# Patient Record
Sex: Female | Born: 1995 | Race: Black or African American | Hispanic: No | Marital: Single | Smoking: Never smoker
Health system: Southern US, Community
[De-identification: ages and names within clinical notes are randomized; demographics above are authoritative.]

## PROBLEM LIST (undated history)

## (undated) DIAGNOSIS — Z30017 Encounter for initial prescription of implantable subdermal contraceptive: Secondary | ICD-10-CM

## (undated) DIAGNOSIS — B009 Herpesviral infection, unspecified: Secondary | ICD-10-CM

## (undated) DIAGNOSIS — E7800 Pure hypercholesterolemia, unspecified: Secondary | ICD-10-CM

## (undated) DIAGNOSIS — I2699 Other pulmonary embolism without acute cor pulmonale: Secondary | ICD-10-CM

## (undated) DIAGNOSIS — Z789 Other specified health status: Secondary | ICD-10-CM

## (undated) HISTORY — DX: Other pulmonary embolism without acute cor pulmonale: I26.99

## (undated) HISTORY — PX: HERNIA REPAIR: SHX51

## (undated) HISTORY — DX: Encounter for initial prescription of implantable subdermal contraceptive: Z30.017

## (undated) HISTORY — PX: NO PAST SURGERIES: SHX2092

## (undated) HISTORY — PX: OTHER SURGICAL HISTORY: SHX169

---
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
## 2013-01-02 ENCOUNTER — Encounter: Payer: Self-pay | Admitting: Obstetrics and Gynecology

## 2013-01-02 ENCOUNTER — Other Ambulatory Visit (HOSPITAL_COMMUNITY): Payer: Self-pay | Admitting: Sports Medicine

## 2013-01-11 ENCOUNTER — Ambulatory Visit: Payer: Medicaid Other | Admitting: Sports Medicine

## 2013-01-15 ENCOUNTER — Other Ambulatory Visit: Payer: Self-pay | Admitting: Sports Medicine

## 2013-01-15 ENCOUNTER — Telehealth: Payer: Self-pay | Admitting: *Deleted

## 2013-01-15 DIAGNOSIS — O1414 Severe pre-eclampsia complicating childbirth: Secondary | ICD-10-CM

## 2013-01-15 MED ORDER — LABETALOL HCL 100 MG PO TABS: 50.0000 mg | ORAL_TABLET | Freq: Two times a day (BID) | ORAL | Status: DC

## 2013-01-15 NOTE — Addendum Note (Signed)
Addended by: Gaspar Bidding D on: 01/15/2013 06:09 PM   Modules accepted: Orders

## 2013-01-15 NOTE — Telephone Encounter (Signed)
Called and informed pt of appt for nexplanon insertion with Dr. Ashley Royalty.Laureen Ochs, Viann Shove

## 2013-01-15 NOTE — Telephone Encounter (Signed)
Changed dose to 50mg ; will need BP check when comes in for Nexplanon followed

## 2013-01-15 NOTE — Telephone Encounter (Signed)
On lebetalol for SEVERE RANGE PREECCLAMPSIA.  Pt now >6 weeks out.  Will titrate off lebetalol, on 100mg  bid, decrease to 50mg  bid.  Then plan to go off at follow up in 2-3 weeks, With BP recheck at [facility] appointment and then again 2-3 weeks later.  If <130/90 okay with stopping completely at this point.

## 2013-01-16 NOTE — Telephone Encounter (Signed)
Will do BP check at nexplanon visit, wants to schedule BP follow up then

## 2013-01-24 ENCOUNTER — Ambulatory Visit (INDEPENDENT_AMBULATORY_CARE_PROVIDER_SITE_OTHER): Payer: Medicaid Other | Admitting: Family Medicine

## 2013-01-24 VITALS — BP 120/77 | HR 86 | Temp 98.5°F | Wt 146.0 lb

## 2013-01-24 DIAGNOSIS — Z309 Encounter for contraceptive management, unspecified: Secondary | ICD-10-CM

## 2013-01-24 DIAGNOSIS — IMO0001 Reserved for inherently not codable concepts without codable children: Secondary | ICD-10-CM

## 2013-01-24 DIAGNOSIS — Z3046 Encounter for surveillance of implantable subdermal contraceptive: Secondary | ICD-10-CM

## 2013-01-24 DIAGNOSIS — Z30017 Encounter for initial prescription of implantable subdermal contraceptive: Secondary | ICD-10-CM

## 2013-01-24 NOTE — Patient Instructions (Addendum)
It was very nice to meet you.  Your nexplanon can stay in for 3 years, you can have it removed at any time.  You can remove the pressure bandage in 24 hours. You can remove the steristrip in 3-4 days or when it falls off. Use a back up method of birth control for the next week If you notice pus draining, have a lot of pain or swelling around the area please return to our clinic.

## 2013-01-25 DIAGNOSIS — Z3046 Encounter for surveillance of implantable subdermal contraceptive: Secondary | ICD-10-CM

## 2013-01-25 DIAGNOSIS — Z30017 Encounter for initial prescription of implantable subdermal contraceptive: Secondary | ICD-10-CM

## 2013-01-25 MED ORDER — ETONOGESTREL 68 MG ~~LOC~~ IMPL
68.0000 mg | DRUG_IMPLANT | Freq: Once | SUBCUTANEOUS | Status: AC
  Administered 2013-01-24: 68 mg via SUBCUTANEOUS

## 2013-01-27 DIAGNOSIS — Z309 Encounter for contraceptive management, unspecified: Secondary | ICD-10-CM | POA: Insufficient documentation

## 2013-01-27 NOTE — Assessment & Plan Note (Signed)
Nexplanon inserted without difficulty, tolerated well.  Patient instructed to use back up contraception x7 days post insertion.  Given red flags that should prompt her return.

## 2013-01-27 NOTE — Progress Notes (Signed)
  Subjective:    Patient ID: Emma Cantu, female    DOB: 1996-09-28, 17 y.o.   MRN: [id number]  HPI  1.  Contraception management:  Patient here because she would like to have nexplanon for contraception.  She tried to have mirena inserted previously however sound would not pass.   She is 12 weeks post partum.  She is has not returned to having periods yet, and is breast feeding.   Review of Systems Per HPI    Objective:   Physical Exam  Constitutional: She appears well-nourished. No distress.  Neurological: She is alert.   NEXPLANON INSERTION PROCEDURE NOTE  After risks and benefits reviewed and informed consent obtained and time out done, urine pregnancy test Negative.  Area prepped with betadine, and approx 2 cc of lidocaine injected into insertion site in Right arm. Target insertion site was marked with sterile marker according to Nexplanon guided ruler.   Nexplanon lot #[id number] inserted without complication.  Both provider and pt palpated the device in place.  Steristrips applied and then pressure bandage.  Pt tolerated procedure well.  Minimal blood loss.       Assessment & Plan:

## 2013-01-28 ENCOUNTER — Encounter: Payer: Medicaid Other | Admitting: Obstetrics and Gynecology

## 2013-02-14 ENCOUNTER — Ambulatory Visit (INDEPENDENT_AMBULATORY_CARE_PROVIDER_SITE_OTHER): Payer: Self-pay | Admitting: *Deleted

## 2013-02-14 VITALS — BP 118/78 | HR 80

## 2013-02-14 DIAGNOSIS — O1414 Severe pre-eclampsia complicating childbirth: Secondary | ICD-10-CM

## 2013-02-14 NOTE — Assessment & Plan Note (Signed)
Stop Lebetalol

## 2013-02-14 NOTE — Progress Notes (Signed)
In for  BP check. BP checked manually using regular adult cuff.  BP LA 118/78 and RA 118/74. Will forward message to Dr. Berline Chough . Patient states Dr. Berline Chough may discontinue labetalol.

## 2013-02-14 NOTE — Progress Notes (Signed)
Please call and have pt stop Lebetalol.  Have titrated down.  Follow up PRN

## 2013-02-15 NOTE — Progress Notes (Signed)
Called and spoke with patient yesterday afternoon and advised her to stop Labatalol per verbal order from Dr. Berline Chough.

## 2013-03-16 ENCOUNTER — Other Ambulatory Visit (HOSPITAL_COMMUNITY): Payer: Self-pay | Admitting: Sports Medicine

## 2014-04-15 ENCOUNTER — Ambulatory Visit (INDEPENDENT_AMBULATORY_CARE_PROVIDER_SITE_OTHER): Payer: Medicaid Other | Admitting: Sports Medicine

## 2014-04-15 ENCOUNTER — Other Ambulatory Visit (HOSPITAL_COMMUNITY)
Admission: RE | Admit: 2014-04-15 | Discharge: 2014-04-15 | Disposition: A | Discharge status: Home / Self Care | Payer: Medicaid Other | Source: Ambulatory Visit | Attending: Sports Medicine | Admitting: Sports Medicine

## 2014-04-15 ENCOUNTER — Encounter: Payer: Self-pay | Admitting: Sports Medicine

## 2014-04-15 VITALS — BP 134/79 | HR 92 | Temp 98.2°F | Ht 64.0 in | Wt 154.0 lb

## 2014-04-15 DIAGNOSIS — Z113 Encounter for screening for infections with a predominantly sexual mode of transmission: Secondary | ICD-10-CM | POA: Insufficient documentation

## 2014-04-15 DIAGNOSIS — Z7251 High risk heterosexual behavior: Secondary | ICD-10-CM

## 2014-04-15 LAB — RPR

## 2014-04-15 LAB — POCT URINE PREGNANCY: Preg Test, Ur: NEGATIVE

## 2014-04-15 NOTE — Progress Notes (Signed)
  Emma Cantu - 18 y.o. female MRN [id number]  Date of birth: 05/08/1996  CC & SUBJECTIVE:      Chief Complaint  Patient presents with  . Exposure to STD    asymptomatic, unprotected intercourse.  s/p Nexplanon Patient reports having consensual, unprotected intercourse.  Denies any discharge, dysuria, urinary symptoms including frequency denies any abdominal pain.  Pt denies any fevers, chills, or rigors. Patient does have Nexplanon in place.  LMP noted.     See problem based charting for additional subjective (including HPI, Interval History & ROS)   HISTORY: Wt Readings from Last 3 Encounters:  04/15/14 154 lb (69.854 kg)  01/24/13 146 lb (66.225 kg) (79%*, Z = 0.80)  12/27/12 151 lb 1.6 oz (68.539 kg) (83%*, Z = 0.96)   * Growth percentiles are based on CDC 2-20 Years data.   BP Readings from Last 3 Encounters:  04/15/14 134/79  02/14/13 118/78  01/24/13 120/77    History  Smoking status  . Never Smoker   Smokeless tobacco  . Not on file   There are no preventive care reminders to display for this patient.  Otherwise past Medical, Surgical, Social, and Family History Reviewed per EMR Medications and Allergies reviewed and updated per below.  VITALS: BP 134/79  Pulse 92  Temp(Src) 98.2 F (36.8 C) (Oral)  Ht 5\' 4"  (1.626 m)  Wt 154 lb (69.854 kg)  BMI 26.42 kg/m2  PHYSICAL EXAM: GENERAL:  young adult [place]. In no discomfort; no respiratory distress  PSYCH: alert and appropriate, good insight  No distress,   GU:  deferred     MEDICATIONS, LABS & OTHER ORDERS: Previous Medications   ACETAMINOPHEN (TYLENOL) 500 MG TABLET    Take 500 mg by mouth every 6 (six) hours as needed. For headache.   IBUPROFEN (ADVIL,MOTRIN) 600 MG TABLET    TAKE ONE TABLET BY MOUTH EVERY 6 HOURS   PRENATAL VIT-FE FUMARATE-FA (PRENATAL MULTIVITAMIN) TABS    Take 1 tablet by mouth daily.   TRIAMCINOLONE CREAM (KENALOG) 0.1 %    Apply topically 2 (two) times daily.    Modified Medications   No medications on file   New Prescriptions   No medications on file   Discontinued Medications   No medications on file   Orders Placed This Encounter  Procedures  . HIV antibody  . RPR  . POCT urine pregnancy   ASSESSMENT & PLAN: See problem based charting & AVS for pt instructions.

## 2014-04-15 NOTE — Patient Instructions (Signed)
Please sign up for my chart.  I Will send results to you through this.  Please be sure to use barrier methods including condoms with any sexual intercourse to decrease your risk of infection.

## 2014-04-15 NOTE — Assessment & Plan Note (Signed)
Acute condition, asymptomatic, high risk behavior 1. First void urine GC chlamydia HIV, RPR, urine pregnancy 2. Discuss safe sexual practices including using condoms

## 2014-04-16 LAB — HIV ANTIBODY (ROUTINE TESTING W REFLEX): HIV: NONREACTIVE

## 2014-04-16 LAB — URINE CYTOLOGY ANCILLARY ONLY
CHLAMYDIA, DNA PROBE: NEGATIVE
Neisseria Gonorrhea: NEGATIVE

## 2014-08-24 ENCOUNTER — Encounter (HOSPITAL_COMMUNITY): Payer: Self-pay | Admitting: Emergency Medicine

## 2014-08-24 ENCOUNTER — Emergency Department (HOSPITAL_COMMUNITY)
Admission: EM | Admit: 2014-08-24 | Discharge: 2014-08-24 | Disposition: A | Discharge status: Home / Self Care | Payer: Medicaid Other | Attending: Emergency Medicine | Admitting: Emergency Medicine

## 2014-08-24 DIAGNOSIS — H9210 Otorrhea, unspecified ear: Secondary | ICD-10-CM | POA: Insufficient documentation

## 2014-08-24 DIAGNOSIS — H9209 Otalgia, unspecified ear: Secondary | ICD-10-CM | POA: Insufficient documentation

## 2014-08-24 DIAGNOSIS — IMO0002 Reserved for concepts with insufficient information to code with codable children: Secondary | ICD-10-CM | POA: Insufficient documentation

## 2014-08-24 DIAGNOSIS — H6092 Unspecified otitis externa, left ear: Secondary | ICD-10-CM

## 2014-08-24 DIAGNOSIS — H60399 Other infective otitis externa, unspecified ear: Secondary | ICD-10-CM | POA: Insufficient documentation

## 2014-08-24 DIAGNOSIS — J3489 Other specified disorders of nose and nasal sinuses: Secondary | ICD-10-CM | POA: Insufficient documentation

## 2014-08-24 DIAGNOSIS — Z79899 Other long term (current) drug therapy: Secondary | ICD-10-CM | POA: Insufficient documentation

## 2014-08-24 MED ORDER — ANTIPYRINE-BENZOCAINE 5.4-1.4 % OT SOLN: 3.0000 [drp] | OTIC | Status: DC | PRN

## 2014-08-24 MED ORDER — HYDROCODONE-ACETAMINOPHEN 5-325 MG PO TABS: 1.0000 | ORAL_TABLET | Freq: Four times a day (QID) | ORAL | Status: DC | PRN

## 2014-08-24 MED ORDER — ONDANSETRON 4 MG PO TBDP
4.0000 mg | ORAL_TABLET | Freq: Once | ORAL | Status: AC
  Administered 2014-08-24: 4 mg via ORAL
  Filled 2014-08-24: qty 1

## 2014-08-24 MED ORDER — NEOMYCIN-POLYMYXIN-HC 3.5-10000-1 OT SUSP: 3.0000 [drp] | Freq: Three times a day (TID) | OTIC | Status: DC

## 2014-08-24 MED ORDER — HYDROCODONE-ACETAMINOPHEN 5-325 MG PO TABS
1.0000 | ORAL_TABLET | Freq: Once | ORAL | Status: AC
  Administered 2014-08-24: 1 via ORAL
  Filled 2014-08-24: qty 1

## 2014-08-24 NOTE — ED Notes (Signed)
Pt reports having a left sided ear ache that started yesterday. Pt reports that when she touches the ear, the ear hurts. Pt reports that the ear has been draining a white colored discharge. Pt also having nasal congestion. Pt is A/O x4 and in NAD.

## 2014-08-24 NOTE — ED Provider Notes (Signed)
Medical screening examination/treatment/procedure(s) were performed by non-physician practitioner and as supervising physician I was immediately available for consultation/collaboration.   EKG Interpretation None       Mckynna Vanloan, MD 08/24/14 1723 

## 2014-08-24 NOTE — ED Provider Notes (Signed)
CSN: [id number]     Arrival date & time 08/24/14  1252 History  This chart was scribed for non-physician practitioner, Arthor Captain, PA-C,working with Doug Sou, MD, by Karle Plumber, ED Scribe. This patient was seen in room WTR7/WTR7 and the patient's care was started at 1:39 PM.  Chief Complaint  Patient presents with  . Otalgia  . Nasal Congestion   The history is provided by the patient. No language interpreter was used.   HPI Comments:  Emma Cantu is a 18 y.o. female who presents to the Emergency Department complaining of moderate, throbbing left ear pain that began yesterday. She reports associated purulent drainage of the ear and nasal congestion that began 3-4 weeks ago. She denies any recent swimming. She denies fever, chills or sore throat.  Past Medical History  Diagnosis Date  . No pertinent past medical history   . Nexplanon insertion 12/2012    Right Arm   Past Surgical History  Procedure Laterality Date  . No past surgeries    . Inguanal hernia repair     Family History  Problem Relation Age of Onset  . Hyperlipidemia Mother   . Hypertension Mother   . Cancer Maternal Grandmother   . Cancer Maternal Grandfather   . Diabetes Paternal Grandmother   . Diabetes Paternal Grandfather    History  Substance Use Topics  . Smoking status: Never Smoker   . Smokeless tobacco: Not on file  . Alcohol Use: No   OB History   Grav Para Term Preterm Abortions TAB SAB Ect Mult Living   0 0 0 0 0 0 1     Review of Systems  Constitutional: Negative for fever and chills.  HENT: Positive for congestion, ear discharge and ear pain. Negative for sore throat.     Allergies  Review of patient's allergies indicates no known allergies.  Home Medications   Prior to Admission medications   Medication Sig Start Date End Date Taking? Authorizing Provider  acetaminophen (TYLENOL) 500 MG tablet Take 500 mg by mouth every 6 (six) hours as needed. For headache.     Historical Provider, MD  ibuprofen (ADVIL,MOTRIN) 600 MG tablet TAKE ONE TABLET BY MOUTH EVERY 6 HOURS 03/16/13   Andrena Mews, DO  Prenatal Vit-Fe Fumarate-FA (PRENATAL MULTIVITAMIN) TABS Take 1 tablet by mouth daily.    Historical Provider, MD  triamcinolone cream (KENALOG) 0.1 % Apply topically 2 (two) times daily. 11/16/12   Ivy de Lawson Radar, DO   Triage Vitals: BP 131/76  Pulse 88  Temp(Src) 99.1 F (37.3 C) (Oral)  Resp 18  SpO2 98%  LMP 07/17/2014 Physical Exam  Nursing note and vitals reviewed. Constitutional: She is oriented to person, place, and time. She appears well-developed and well-nourished.  HENT:  Head: Normocephalic and atraumatic.  Right Ear: Tympanic membrane and ear canal normal.  Left Ear: Tympanic membrane normal. There is drainage and tenderness.  Purulence in canal of left ear. Left TM is visualized and normal.  Eyes: EOM are normal.  Neck: Normal range of motion.  Cardiovascular: Normal rate.   Pulmonary/Chest: Effort normal.  Musculoskeletal: Normal range of motion.  Neurological: She is alert and oriented to person, place, and time.  Skin: Skin is warm and dry.  Psychiatric: She has a normal mood and affect. Her behavior is normal.    ED Course  Procedures (including critical care time) DIAGNOSTIC STUDIES: Oxygen Saturation is 98% on RA, normal by my interpretation.   COORDINATION  OF CARE: 1:43 PM- Will prescribe antibiotic otic drops. Pt verbalizes understanding and agrees to plan.  Medications - No data to display  Labs Review Labs Reviewed - No data to display  Imaging Review No results found.   EKG Interpretation None      MDM   Final diagnoses:  Otitis externa, left    Otitis externa  Pt presenting with otitis externa after swimming. No canal occlusion, Pt afebrile in NAD. Exam non concerning for mastoiditis, cellulitis or malignant OE.  Dc with cortisporin script. Follow up in 2-3 days if no improvement with treatment or no  complete resolution by 7 days.   I personally performed the services described in this documentation, which was scribed in my presence. The recorded information has been reviewed and is accurate.    Arthor Captain, PA-C 08/24/14 1429

## 2014-10-27 ENCOUNTER — Encounter (HOSPITAL_COMMUNITY): Payer: Self-pay | Admitting: Emergency Medicine

## 2016-03-07 ENCOUNTER — Ambulatory Visit (INDEPENDENT_AMBULATORY_CARE_PROVIDER_SITE_OTHER): Payer: Medicaid Other | Admitting: Family Medicine

## 2016-03-07 ENCOUNTER — Encounter: Payer: Self-pay | Admitting: Family Medicine

## 2016-03-07 VITALS — BP 111/61 | HR 82 | Temp 98.6°F | Ht 64.0 in | Wt 211.2 lb

## 2016-03-07 DIAGNOSIS — Z3046 Encounter for surveillance of implantable subdermal contraceptive: Secondary | ICD-10-CM | POA: Diagnosis not present

## 2016-03-07 NOTE — Progress Notes (Signed)
Subjective: Emma Cantu is a 20 y.o. female presenting for nexplanon removal.  Had nexplanon placed 01/24/2013. Would like to practice barrier conraception and discuss alternative hormonal contraception at a separate office visit.   - ROS: No recent arm infections.  - Non-smoker  Objective: Ht 5\' 4"  (1.626 m)  Wt 211 lb 3.2 oz (95.8 kg)  BMI 36.23 kg/m2  LMP 02/15/2016 (Approximate) Gen: Obese, well-appearing 20 y.o. female in no distress RUE: Superficial axially-oriented rod in subcutaneous fat without overlaying erythema or warmth.   PROCEDURE NOTE: Nexplanon Removal  Patient given informed consent for removal of nexplanon including risks, benefits and alternatives to procedures. All questions answered.  Signed copy in the chart.  Appropriate time out taken.  Nexplanon site identified in right upper arm.  Area prepped in usual sterile fashon. One cc of 1% lidocaine was used to anesthetize the area overlying the distal tip of the implant. A small stab incision was made right beside the implant on the distal portion. The nexplanon rod was grasped using hemostat and removed without difficulty. There was less than 3 cc blood loss. There were no complications. A small amount of antibiotic ointment and steri-strips were applied over the small incision. A pressure bandage was applied to reduce any bruising. The patient tolerated the procedure well and was given post procedure instructions.  Drs. Hensel and Gwendolyn GrantWalden were readily available, were there any questions or issues.   Assessment/Plan: Emma Cantu is a 20 y.o. female here for nexplanon removal. - Regular wound care instructions provided - To return if signs of infection develop - Follow up soon to discuss contraception. Will use barrier contraception in the meantime.

## 2016-03-07 NOTE — Patient Instructions (Signed)
Sterile Tape Wound Care °Some cuts and wounds can be closed using sterile tape, also called skin adhesive strips. Skin adhesive strips can be used for shallow (superficial) and simple cuts, wounds, lacerations, and surgical incisions. These strips act in place of stitches to hold the edges of the wound together, allowing for faster healing. Unlike stitches, the adhesive strips do not require needles or anesthetic medicine for placement. The strips will wear off naturally as the wound is healing. It is important to take proper care of your wound at home while it heals.  °HOME CARE INSTRUCTIONS °· Try to keep the area around your wound clean and dry. Do not allow the adhesive strips to get wet for the first 12 hours.   °· Do not use any soaps or ointments on the wound for the first 12 hours.   °· If a bandage (dressing) has been applied, follow your health care provider's instructions for how often to change the dressing. Keep the dressing dry if one has been applied.   °· Do not remove the adhesive strips. They will fall off on their own. If they do not, you may remove them gently after 10 days. You should gently wet the strips before removing them. For example, this can be done in the shower. °· Do not scratch, pick, or rub the wound area.   °· Protect the wound from further injury until it is healed.   °· Protect the wound from sun and tanning bed exposure while it is healing and for several weeks after healing.   °· Only take over-the-counter or prescription medicines as directed by your health care provider.   °· Keep all follow-up appointments as directed by your health care provider.   °SEEK MEDICAL CARE IF: °Your adhesive strips become wet or soaked with blood before the wound has healed. The tape will need to be replaced.  °SEEK IMMEDIATE MEDICAL CARE IF: °· You have increasing pain in the wound.   °· You develop a rash after the strips are applied. °· Your wound becomes red, swollen, hot, or tender.   °· You  have a red streak that goes away from the wound.   °· You have pus coming from the wound.   °· You have increased bleeding from the wound. °· You notice a bad smell coming from the wound.   °· Your wound breaks open. °MAKE SURE YOU: °· Understand these instructions. °· Will watch your condition. °· Will get help right away if you are not doing well or get worse. °  °This information is not intended to replace advice given to you by your health care provider. Make sure you discuss any questions you have with your health care provider. °  °Document Released: 01/19/2005 Document Revised: 01/02/2015 Document Reviewed: 07/03/2013 °Elsevier Interactive Patient Education ©2016 Elsevier Inc. ° °

## 2016-04-21 ENCOUNTER — Ambulatory Visit (INDEPENDENT_AMBULATORY_CARE_PROVIDER_SITE_OTHER): Payer: Medicaid Other | Admitting: Family Medicine

## 2016-04-21 ENCOUNTER — Encounter: Payer: Self-pay | Admitting: Family Medicine

## 2016-04-21 VITALS — BP 126/68 | HR 85 | Temp 98.2°F | Ht 64.0 in | Wt 208.0 lb

## 2016-04-21 DIAGNOSIS — Z3009 Encounter for other general counseling and advice on contraception: Secondary | ICD-10-CM | POA: Diagnosis present

## 2016-04-21 NOTE — Assessment & Plan Note (Signed)
After discussion of [name] options, she chooses to proceed with [name]. All questions answered, handout provided. Benefits and risks discussed at length regarding insertion procedure and ongoing care. - Will schedule appointment for insertion procedure.  - Will also get pap smear prior to insertion - Will need UPreg if not within 5 days of LMP.

## 2016-04-21 NOTE — Patient Instructions (Signed)
Thank you for coming in today!  - Schedule an appointment with me to have the [name] inserted. We will also perform a pap smear at that visit, as you are due for this.   Our clinic's number is [phone]. Feel free to call any time with questions or concerns. We will answer any questions after hours with our 24-hour emergency line at that number as well.   - Dr. Jarvis NewcomerGrunz  Levonorgestrel intrauterine device (IUD) What is this medicine? LEVONORGESTREL IUD (LEE voe nor jes trel) is a contraceptive (birth control) device. The device is placed inside the uterus by a healthcare professional. It is used to prevent pregnancy and can also be used to treat heavy bleeding that occurs during your period. Depending on the device, it can be used for 3 to 5 years. This medicine may be used for other purposes; ask your health care provider or pharmacist if you have questions. What should I tell my health care provider before I take this medicine? They need to know if you have any of these conditions: -abnormal Pap smear -cancer of the breast, uterus, or cervix -diabetes -endometritis -genital or pelvic infection now or in the past -have more than one sexual partner or your partner has more than one partner -heart disease -history of an ectopic or tubal pregnancy -immune system problems -IUD in place -liver disease or tumor -problems with blood clots or take blood-thinners -use intravenous drugs -uterus of unusual shape -vaginal bleeding that has not been explained -an unusual or allergic reaction to levonorgestrel, other hormones, silicone, or polyethylene, medicines, foods, dyes, or preservatives -pregnant or trying to get pregnant -breast-feeding How should I use this medicine? This device is placed inside the uterus by a health care professional. Talk to your pediatrician regarding the use of this medicine in children. Special care may be needed. Overdosage: If you think you have taken too  much of this medicine contact a poison control center or emergency room at once. NOTE: This medicine is only for you. Do not share this medicine with others. What if I miss a dose? This does not apply. What may interact with this medicine? Do not take this medicine with any of the following medications: -amprenavir -bosentan -fosamprenavir This medicine may also interact with the following medications: -aprepitant -barbiturate medicines for inducing sleep or treating seizures -bexarotene -griseofulvin -medicines to treat seizures like carbamazepine, ethotoin, felbamate, oxcarbazepine, phenytoin, topiramate -modafinil -pioglitazone -rifabutin -rifampin -rifapentine -some medicines to treat HIV infection like atazanavir, indinavir, lopinavir, nelfinavir, tipranavir, ritonavir -St. John's wort -warfarin This list may not describe all possible interactions. Give your health care provider a list of all the medicines, herbs, non-prescription drugs, or dietary supplements you use. Also tell them if you smoke, drink alcohol, or use illegal drugs. Some items may interact with your medicine. What should I watch for while using this medicine? Visit your doctor or health care professional for regular check ups. See your doctor if you or your partner has sexual contact with others, becomes HIV positive, or gets a sexual transmitted disease. This product does not protect you against HIV infection (AIDS) or other sexually transmitted diseases. You can check the placement of the IUD yourself by reaching up to the top of your vagina with clean fingers to feel the threads. Do not pull on the threads. It is a good habit to check placement after each menstrual period. Call your doctor right away if you feel more of the IUD than just the threads or  if you cannot feel the threads at all. The IUD may come out by itself. You may become pregnant if the device comes out. If you notice that the IUD has come out use  a backup birth control method like condoms and call your health care provider. Using tampons will not change the position of the IUD and are okay to use during your period. What side effects may I notice from receiving this medicine? Side effects that you should report to your doctor or health care professional as soon as possible: -allergic reactions like skin rash, itching or hives, swelling of the face, lips, or tongue -fever, flu-like symptoms -genital sores -high blood pressure -no menstrual period for 6 weeks during use -pain, swelling, warmth in the leg -pelvic pain or tenderness -severe or sudden headache -signs of pregnancy -stomach cramping -sudden shortness of breath -trouble with balance, talking, or walking -unusual vaginal bleeding, discharge -yellowing of the eyes or skin Side effects that usually do not require medical attention (report to your doctor or health care professional if they continue or are bothersome): -acne -breast pain -change in sex drive or performance -changes in weight -cramping, dizziness, or faintness while the device is being inserted -headache -irregular menstrual bleeding within first 3 to 6 months of use -nausea This list may not describe all possible side effects. Call your doctor for medical advice about side effects. You may report side effects to FDA at 1-800-FDA-1088. Where should I keep my medicine? This does not apply. NOTE: This sheet is a summary. It may not cover all possible information. If you have questions about this medicine, talk to your doctor, pharmacist, or health care provider.    2016, Elsevier/Gold Standard. (2012-01-12 13:54:04)

## 2016-04-21 NOTE — Progress Notes (Signed)
Subjective: Emma Cantu is a 20 y.o. G1P1 presenting to discuss contraception.   She reports good experience with nexplanon following the birth of her only child, having normal menses alternating with no bleeding, without spotting. Her mother reports she had mood swings with this, and would prefer Emma Cantu to have something else for birth control. She would prefer [name] over pills/patch. After online research and speaking with friends, she has the most interest in [name] vs. copper IUD. It is important that she experience as little unscheduled vaginal bleeding as possible. She denies smoking, personal or family history of clots, recent sexual activity (have been using condoms since removal of nexplanon). She does not desire to be pregnant for the next 5 years.   - ROS: As above and no vaginal bleeding, discharge, abd pain, fevers.  - Non-smoker  Objective: BP 126/68 mmHg  Pulse 85  Temp(Src) 98.2 F (36.8 C) (Oral)  Ht 5\' 4"  (1.626 m)  Wt 208 lb (94.348 kg)  BMI 35.69 kg/m2  LMP 04/09/2016 Gen: Well-appearing 20 y.o. female in no distress  Assessment/Plan: Emma Cantu is a 20 y.o. female here for contraceptive counseling.  Contraception management After discussion of [name] options, she chooses to proceed with [name]. All questions answered, handout provided. Benefits and risks discussed at length regarding insertion procedure and ongoing care. - Will schedule appointment for insertion procedure.  - Will also get pap smear prior to insertion - Will need UPreg if not within 5 days of LMP.

## 2016-04-28 ENCOUNTER — Ambulatory Visit (INDEPENDENT_AMBULATORY_CARE_PROVIDER_SITE_OTHER): Payer: Medicaid Other | Admitting: Family Medicine

## 2016-04-28 ENCOUNTER — Other Ambulatory Visit (HOSPITAL_COMMUNITY)
Admission: RE | Admit: 2016-04-28 | Discharge: 2016-04-28 | Disposition: A | Discharge status: Home / Self Care | Payer: Medicaid Other | Source: Ambulatory Visit | Attending: Family Medicine | Admitting: Family Medicine

## 2016-04-28 ENCOUNTER — Encounter: Payer: Self-pay | Admitting: Family Medicine

## 2016-04-28 VITALS — BP 124/70 | HR 81 | Temp 98.4°F | Ht 64.0 in | Wt 209.3 lb

## 2016-04-28 DIAGNOSIS — Z01419 Encounter for gynecological examination (general) (routine) without abnormal findings: Secondary | ICD-10-CM | POA: Insufficient documentation

## 2016-04-28 DIAGNOSIS — Z124 Encounter for screening for malignant neoplasm of cervix: Secondary | ICD-10-CM | POA: Diagnosis not present

## 2016-04-28 DIAGNOSIS — Z3009 Encounter for other general counseling and advice on contraception: Secondary | ICD-10-CM

## 2016-04-28 DIAGNOSIS — Z30019 Encounter for initial prescription of contraceptives, unspecified: Secondary | ICD-10-CM

## 2016-04-28 DIAGNOSIS — Z3040 Encounter for surveillance of contraceptives, unspecified: Secondary | ICD-10-CM

## 2016-04-28 DIAGNOSIS — Z30017 Encounter for initial prescription of implantable subdermal contraceptive: Secondary | ICD-10-CM | POA: Diagnosis not present

## 2016-04-28 LAB — POCT URINE PREGNANCY: Preg Test, Ur: NEGATIVE

## 2016-04-28 MED ORDER — ETONOGESTREL 68 MG ~~LOC~~ IMPL
68.0000 mg | DRUG_IMPLANT | Freq: Once | SUBCUTANEOUS | Status: AC
  Administered 2016-04-28: 68 mg via SUBCUTANEOUS

## 2016-04-28 NOTE — Progress Notes (Signed)
I was asked to place nexplanon during today's visit, as Dr. Jarvis NewcomerGrunz is not certified for nexplanon insertion.  Reviewed history with patient. No history of blood clots, stroke, heart attack. Nonsmoker. Has had nexplanon before and liked it. Last unprotected intercourse was >3 weeks ago. LMP in mid April. Urine pregnancy test today negative.  Nexplanon Insertion Procedure Patient was given informed consent, she signed consent form.  Patient does understand that irregular bleeding is a very common side effect of this medication. She was advised to have backup contraception for one week after placement. Pregnancy test in clinic today was negative.  Appropriate time out taken.  The ruler used to measure and mark insertion area in right arm.  Patient was prepped with alcohol swab and then injected with 1 ml of 1% lidocaine without epinephrine.  She was prepped with betadine, Nexplanon removed from packaging, then inserted full length of needle and withdrawn per handbook instructions. Nexplanon was able to palpated in the patient's arm; patient palpated the insert herself. There was minimal blood loss.  Patient insertion site covered with steristrip and a pressure bandage to reduce any bruising.  The patient tolerated the procedure well and was given post procedure instructions to keep area clean/dry for next 24 hours. After that point may shower.  Levert FeinsteinBrittany Mirha Brucato, MD [facility]

## 2016-04-29 NOTE — Progress Notes (Signed)
Subjective: Emma Cantu is a 20 y.o. female returning for [name].   She reports that she would prefer to proceed with nexplanon rather than [name] because she is sure of what she will get out of it and cannot be certain of mirena.   - ROS: No fevers, chills, abd pain, vaginal pain, discharge, bleeding.  - Non-smoker  Objective: BP 124/70 mmHg  Pulse 81  Temp(Src) 98.4 F (36.9 C) (Oral)  Ht 5\' 4"  (1.626 m)  Wt 209 lb 4.8 oz (94.938 kg)  BMI 35.91 kg/m2  LMP 04/09/2016 Gen: Well 20 y.o. female in no distress Pelvic: Normal external female genitalia without lesions. Vaginal mucosa and cervix normal without lesions, discharge or bleeding noted on speculum exam. No cervical motion tenderness.  - April Zimmerman Rumple, CMA present throughout duration of exam.  UPreg negative  Assessment/Plan: Emma Cantu is a 20 y.o. female here for [name] and pap smear. - Pap smear obtained today - Nexplanon inserted by Dr. Pollie MeyerMcIntyre, see other visit note.

## 2016-04-29 NOTE — Patient Instructions (Signed)
Etonogestrel implant What is this medicine? ETONOGESTREL (et oh noe JES trel) is a contraceptive (birth control) device. It is used to prevent pregnancy. It can be used for up to 3 years. This medicine may be used for other purposes; ask your health care provider or pharmacist if you have questions. What should I tell my health care provider before I take this medicine? They need to know if you have any of these conditions: -abnormal vaginal bleeding -blood vessel disease or blood clots -cancer of the breast, cervix, or liver -depression -diabetes -gallbladder disease -headaches -heart disease or recent heart attack -high blood pressure -high cholesterol -kidney disease -liver disease -renal disease -seizures -tobacco smoker -an unusual or allergic reaction to etonogestrel, other hormones, anesthetics or antiseptics, medicines, foods, dyes, or preservatives -pregnant or trying to get pregnant -breast-feeding How should I use this medicine? This device is inserted just under the skin on the inner side of your upper arm by a health care professional. Talk to your pediatrician regarding the use of this medicine in children. Special care may be needed. Overdosage: If you think you have taken too much of this medicine contact a poison control center or emergency room at once. NOTE: This medicine is only for you. Do not share this medicine with others. What if I miss a dose? This does not apply. What may interact with this medicine? Do not take this medicine with any of the following medications: -amprenavir -bosentan -fosamprenavir This medicine may also interact with the following medications: -barbiturate medicines for inducing sleep or treating seizures -certain medicines for fungal infections like ketoconazole and itraconazole -griseofulvin -medicines to treat seizures like carbamazepine, felbamate, oxcarbazepine, phenytoin,  topiramate -modafinil -phenylbutazone -rifampin -some medicines to treat HIV infection like atazanavir, indinavir, lopinavir, nelfinavir, tipranavir, ritonavir -St. John's wort This list may not describe all possible interactions. Give your health care provider a list of all the medicines, herbs, non-prescription drugs, or dietary supplements you use. Also tell them if you smoke, drink alcohol, or use illegal drugs. Some items may interact with your medicine. What should I watch for while using this medicine? This product does not protect you against HIV infection (AIDS) or other sexually transmitted diseases. You should be able to feel the implant by pressing your fingertips over the skin where it was inserted. Contact your doctor if you cannot feel the implant, and use a non-hormonal birth control method (such as condoms) until your doctor confirms that the implant is in place. If you feel that the implant may have broken or become bent while in your arm, contact your healthcare provider. What side effects may I notice from receiving this medicine? Side effects that you should report to your doctor or health care professional as soon as possible: -allergic reactions like skin rash, itching or hives, swelling of the face, lips, or tongue -breast lumps -changes in emotions or moods -depressed mood -heavy or prolonged menstrual bleeding -pain, irritation, swelling, or bruising at the insertion site -scar at site of insertion -signs of infection at the insertion site such as fever, and skin redness, pain or discharge -signs of pregnancy -signs and symptoms of a blood clot such as breathing problems; changes in vision; chest pain; severe, sudden headache; pain, swelling, warmth in the leg; trouble speaking; sudden numbness or weakness of the face, arm or leg -signs and symptoms of liver injury like dark yellow or brown urine; general ill feeling or flu-like symptoms; light-colored stools; loss of  appetite; nausea; right upper belly   pain; unusually weak or tired; yellowing of the eyes or skin -unusual vaginal bleeding, discharge -signs and symptoms of a stroke like changes in vision; confusion; trouble speaking or understanding; severe headaches; sudden numbness or weakness of the face, arm or leg; trouble walking; dizziness; loss of balance or coordination Side effects that usually do not require medical attention (Report these to your doctor or health care professional if they continue or are bothersome.): -acne -back pain -breast pain -changes in weight -dizziness -general ill feeling or flu-like symptoms -headache -irregular menstrual bleeding -nausea -sore throat -vaginal irritation or inflammation This list may not describe all possible side effects. Call your doctor for medical advice about side effects. You may report side effects to FDA at 1-800-FDA-1088. Where should I keep my medicine? This drug is given in a hospital or clinic and will not be stored at home. NOTE: This sheet is a summary. It may not cover all possible information. If you have questions about this medicine, talk to your doctor, pharmacist, or health care provider.    2016, Elsevier/Gold Standard. (2014-09-26 14:07:06)  

## 2016-05-02 ENCOUNTER — Telehealth: Payer: Self-pay | Admitting: Family Medicine

## 2016-05-02 NOTE — Telephone Encounter (Signed)
I can't see a formal diagnosis of eczema and I've not evaluated her for this. She will need to schedule an appointment for evaluation.

## 2016-05-02 NOTE — Telephone Encounter (Signed)
Pt called and has eczema and she has been using OTC medication for this. At this time the OTC medication is no longer working and she would like the doctor to call in something to her pharmacy. jw

## 2016-05-03 LAB — CYTOLOGY - PAP

## 2016-05-03 NOTE — Telephone Encounter (Signed)
PT has same day appointment scheduled for tomorrow, she states that she is broke out everywhere. Lamonte SakaiZimmerman Rumple, April D, [place]

## 2016-05-04 ENCOUNTER — Ambulatory Visit: Payer: Medicaid Other | Admitting: Family Medicine

## 2016-05-26 ENCOUNTER — Ambulatory Visit: Payer: Medicaid Other

## 2016-06-09 ENCOUNTER — Ambulatory Visit (INDEPENDENT_AMBULATORY_CARE_PROVIDER_SITE_OTHER): Payer: Medicaid Other | Admitting: Family Medicine

## 2016-06-09 ENCOUNTER — Other Ambulatory Visit (HOSPITAL_COMMUNITY)
Admission: RE | Admit: 2016-06-09 | Discharge: 2016-06-09 | Disposition: A | Discharge status: Home / Self Care | Payer: Medicaid Other | Source: Ambulatory Visit | Attending: Family Medicine | Admitting: Family Medicine

## 2016-06-09 VITALS — BP 125/79 | HR 79 | Temp 98.6°F | Resp 18 | Ht 64.0 in | Wt 205.8 lb

## 2016-06-09 DIAGNOSIS — Z124 Encounter for screening for malignant neoplasm of cervix: Secondary | ICD-10-CM | POA: Diagnosis not present

## 2016-06-09 DIAGNOSIS — J029 Acute pharyngitis, unspecified: Secondary | ICD-10-CM | POA: Diagnosis not present

## 2016-06-09 DIAGNOSIS — Z01419 Encounter for gynecological examination (general) (routine) without abnormal findings: Secondary | ICD-10-CM | POA: Insufficient documentation

## 2016-06-09 LAB — POCT RAPID STREP A (OFFICE): Rapid Strep A Screen: NEGATIVE

## 2016-06-09 NOTE — Progress Notes (Signed)
S: Patient had pap smear with no component of transformation zone so is here for repeat pap, Bo hx prior abnormal paps. Also c/o sore throat 1 week maybe improving. No fever. O: Overweight femal NAD GU extrernally normal. No adnexal masses or uterine tenderness on bimanual exam Cervix appears normal on speculum exam. HEENT: Op is without exudate or lesion. No LAD in neck  A/P: 1. sore throat--Strep test obtained--negative. Likely allergic rhinitis. Try OTC claritin or zyrtec.  2. Inadequate pap : repeat Pap obtained.

## 2016-06-10 LAB — CYTOLOGY - PAP

## 2016-06-13 ENCOUNTER — Encounter: Payer: Self-pay | Admitting: Family Medicine

## 2016-06-24 ENCOUNTER — Ambulatory Visit: Payer: Medicaid Other | Admitting: Internal Medicine

## 2016-06-24 ENCOUNTER — Ambulatory Visit: Payer: Medicaid Other | Admitting: Family Medicine

## 2016-09-23 ENCOUNTER — Encounter: Payer: Self-pay | Admitting: Family Medicine

## 2016-09-23 ENCOUNTER — Ambulatory Visit (INDEPENDENT_AMBULATORY_CARE_PROVIDER_SITE_OTHER): Payer: Medicaid Other | Admitting: Family Medicine

## 2016-09-23 DIAGNOSIS — L309 Dermatitis, unspecified: Secondary | ICD-10-CM | POA: Insufficient documentation

## 2016-09-23 MED ORDER — HYDROCORTISONE 2.5 % EX CREA: TOPICAL_CREAM | Freq: Two times a day (BID) | CUTANEOUS | 8 refills | Status: DC

## 2016-09-23 NOTE — Assessment & Plan Note (Addendum)
Patient reported no relief from over-the-counter hydrocortisone and various emollients.  She has very mild eczema.  - Start hydrocortisone 2.5% 2 times daily for 2-4 weeks - Continue emollient several times daily along with hydrocortisone - Use moisturizing soaps and continue to moisturize after bathing

## 2016-09-23 NOTE — Progress Notes (Signed)
    Subjective:  Emma Cantu is a 20 y.o. female who [facility] with a chief complaint of Eczema  HPI:  Eczema: Patient has had eczema on and off throughout her life.  It has gotten worse over the past 3 years after she had her daughter.  She reports that over-the-counter medications have not been helping.  He has tried 1% hydrocortisone cream and various emollients.  Notes that she has eczema on her face sparing the nose, shoulders and back of the neck and her right wrist and all areas are extremely itchy.  Patient denies any shortness of breath or wheezing.   PMH: Allergic rhinitis ROS: The history of present illness  Objective:  Physical Exam: BP 125/68   Pulse 74   Temp 99.1 F (37.3 C) (Oral)   Ht 5\' 4"  (1.626 m)   Wt 206 lb 3.2 oz (93.5 kg)   BMI 35.39 kg/m   Gen: 20 year old female in NAD, resting comfortably CV: RRR with no murmurs appreciated Pulm: NWOB, CTAB with no crackles, wheezes, or rhonchi GI: Normal bowel sounds present. Soft, Nontender, Nondistended. MSK: no edema, cyanosis, or clubbing noted Skin: warm, dry.  Did not appreciate any erythematous lesions, no rash.   Neuro: grossly normal, moves all extremities Psych: Normal affect and thought content  No results found for this or any previous visit (from the past 72 hour(s)).   Assessment/Plan:  Eczema Patient reported no relief from over-the-counter hydrocortisone and various emollients.  She has very mild eczema.  - Start hydrocortisone 2.5% 2 times daily for 2-4 weeks - Continue emollient several times daily along with hydrocortisone - Use moisturizing soaps and continue to moisturize after bathing

## 2016-09-23 NOTE — Patient Instructions (Signed)
Emma Cantu, he was seen today for evaluation of your eczema.  I have prescribed 2.5 percent hydrocortisone that you can apply 2 times daily. I recommend using this for 2-4 weeks or less if you see results and then 3-4 more days after that.    He should also be using an emollient several times a day in addition to the hydrocortisone. Things that can help with her eczema is to avoid showering every day and pat yourself dry after showers and apply moisturizer after.  Also be sure to use a moisturizing soap  Take care, Mitch Arquette L. Myrtie SomanWarden, MD [facility] Resident PGY-1 09/23/2016 4:07 PM

## 2017-05-23 ENCOUNTER — Ambulatory Visit (INDEPENDENT_AMBULATORY_CARE_PROVIDER_SITE_OTHER): Payer: Medicaid Other | Admitting: Family Medicine

## 2017-05-23 ENCOUNTER — Other Ambulatory Visit (HOSPITAL_COMMUNITY)
Admission: RE | Admit: 2017-05-23 | Discharge: 2017-05-23 | Disposition: A | Discharge status: Home / Self Care | Payer: Medicaid Other | Source: Ambulatory Visit | Attending: Family Medicine | Admitting: Family Medicine

## 2017-05-23 ENCOUNTER — Other Ambulatory Visit: Payer: Self-pay | Admitting: Family Medicine

## 2017-05-23 VITALS — BP 120/68 | HR 84 | Temp 99.9°F | Ht 64.0 in | Wt 204.0 lb

## 2017-05-23 DIAGNOSIS — Z3009 Encounter for other general counseling and advice on contraception: Secondary | ICD-10-CM | POA: Insufficient documentation

## 2017-05-23 DIAGNOSIS — Z113 Encounter for screening for infections with a predominantly sexual mode of transmission: Secondary | ICD-10-CM | POA: Diagnosis not present

## 2017-05-23 DIAGNOSIS — Z202 Contact with and (suspected) exposure to infections with a predominantly sexual mode of transmission: Secondary | ICD-10-CM | POA: Diagnosis not present

## 2017-05-23 LAB — POCT WET PREP (WET MOUNT)
CLUE CELLS WET PREP WHIFF POC: POSITIVE
Trichomonas Wet Prep HPF POC: ABSENT

## 2017-05-23 MED ORDER — METRONIDAZOLE 500 MG PO TABS: 500.0000 mg | ORAL_TABLET | Freq: Two times a day (BID) | ORAL | 0 refills | Status: DC

## 2017-05-23 MED ORDER — CEFTRIAXONE SODIUM 250 MG IJ SOLR
250.0000 mg | Freq: Once | INTRAMUSCULAR | Status: AC
  Administered 2017-05-23: 250 mg via INTRAMUSCULAR

## 2017-05-23 MED ORDER — AZITHROMYCIN 500 MG PO TABS
1000.0000 mg | ORAL_TABLET | Freq: Once | ORAL | Status: AC
  Administered 2017-05-23: 1000 mg via ORAL

## 2017-05-23 NOTE — Progress Notes (Signed)
   Subjective:   Emma Cantu is a 21 y.o. female with a history of Allergic rhinitis, eczema here for same day appt for  Chief Complaint  Patient presents with  . std check    Patient is here today for STD testing. Her boyfriend and sole sexual partner for the last 7 months told her last week that he tested positive for chlamydia after having sexual intercourse with another woman. She has noticed that vaginal discharge is been present for about 1 week. He does have a history of chlamydia many years ago. She denies fever, abdominal pain, vaginal bleeding. She has a Nexplanon in place and her LMP was 04/09/17, but her periods are irregular. She reports that they use condoms intermittently with intercourse.  Review of Systems:  Per HPI.   Social History: never smoker  Objective:  BP 120/68   Pulse 84   Temp 99.9 F (37.7 C) (Oral)   Ht 5\' 4"  (1.626 Cantu)   Wt 204 lb (92.5 kg)   SpO2 98%   BMI 35.02 kg/Cantu   Gen:  21 y.o. female in NAD HEENT: NCAT, MMM, anicteric sclerae CV: Reg rate Resp: Non-labored Abd: Soft, NTND, BS present, no guarding or organomegaly GYN:  External genitalia within normal limits.  Vaginal mucosa pink, moist, normal rugae.  Nonfriable cervix without lesions, no bleeding, +green discharge noted on speculum exam.  Bimanual exam revealed normal, nongravid uterus.  No cervical motion tenderness. No adnexal masses bilaterally.   Ext: WWP, no edema MSK: Full ROM, strength intact Neuro: Alert and oriented, speech normal      Assessment & Plan:     Emma Cantu is a 21 y.o. female here for   Screen for STD (sexually transmitted disease) Concern for possible chlamydia or gonorrhea infection given known positive sexual contact and discharge noted on exam GC chlamydia swab, wet prep, HIV, RPR collected today Empirically treat with ceftriaxone and azithromycin No evidence of PID Return precautions discussed No unprotected sex for the next week   Emma Cantu,  Emma SchleinAngela M, MD MPH PGY-3,  [facility] 05/23/2017  10:15 AM

## 2017-05-23 NOTE — Patient Instructions (Signed)
Safe Sex Practicing safe sex means taking steps before and during sex to reduce your risk of:  Getting an STD (sexually transmitted disease).  Giving your partner an STD.  Unwanted pregnancy. How can I practice safe sex?   To practice safe sex:  Limit your sexual partners to only one partner who is having sex with only you.  Avoid using alcohol and recreational drugs before having sex. These substances can affect your judgment.  Before having sex with a new partner:  Talk to your partner about past partners, past STDs, and drug use.  You and your partner should be screened for STDs and discuss the results with each other.  Check your body regularly for sores, blisters, rashes, or unusual discharge. If you notice any of these problems, visit your health care provider.  If you have symptoms of an infection or you are being treated for an STD, avoid sexual contact.  While having sex, use a condom. Make sure to:  Use a condom every time you have vaginal, oral, or anal sex. Both females and males should wear condoms during oral sex.  Keep condoms in place from the beginning to the end of sexual activity.  Use a latex condom, if possible. Latex condoms offer the best protection.  Use only water-based lubricants or oils to lubricate a condom. Using petroleum-based lubricants or oils will weaken the condom and increase the chance that it will break.  See your health care provider for regular screenings, exams, and tests for STDs.  Talk with your health care provider about the form of birth control (contraception) that is best for you.  Get vaccinated against hepatitis B and human papillomavirus (HPV).  If you are at risk of being infected with HIV (human immunodeficiency virus), talk with your health care provider about taking a prescription medicine to prevent HIV infection. You are considered at risk for HIV if:  You are a man who has sex with other men.  You are a  heterosexual man or woman who is sexually active with more than one partner.  You take drugs by injection.  You are sexually active with a partner who has HIV. This information is not intended to replace advice given to you by your health care provider. Make sure you discuss any questions you have with your health care provider. Document Released: 01/19/2005 Document Revised: 04/27/2016 Document Reviewed: 11/01/2015 Elsevier Interactive Patient Education  2017 Elsevier Inc.  

## 2017-05-23 NOTE — Assessment & Plan Note (Signed)
Concern for possible chlamydia or gonorrhea infection given known positive sexual contact and discharge noted on exam GC chlamydia swab, wet prep, HIV, RPR collected today Empirically treat with ceftriaxone and azithromycin No evidence of PID Return precautions discussed No unprotected sex for the next week

## 2017-05-24 LAB — CERVICOVAGINAL ANCILLARY ONLY
CHLAMYDIA, DNA PROBE: NEGATIVE
Neisseria Gonorrhea: POSITIVE — AB
Trichomonas: NEGATIVE

## 2017-05-24 LAB — HIV ANTIBODY (ROUTINE TESTING W REFLEX): HIV Screen 4th Generation wRfx: NONREACTIVE

## 2017-05-24 LAB — RPR: RPR Ser Ql: NONREACTIVE

## 2017-11-27 ENCOUNTER — Other Ambulatory Visit: Payer: Self-pay

## 2017-11-27 ENCOUNTER — Emergency Department (HOSPITAL_COMMUNITY)
Admission: EM | Admit: 2017-11-27 | Discharge: 2017-11-27 | Disposition: A | Discharge status: Home / Self Care | Payer: Medicaid Other | Attending: Emergency Medicine | Admitting: Emergency Medicine

## 2017-11-27 ENCOUNTER — Encounter (HOSPITAL_COMMUNITY): Payer: Self-pay | Admitting: Emergency Medicine

## 2017-11-27 DIAGNOSIS — A6004 Herpesviral vulvovaginitis: Secondary | ICD-10-CM | POA: Diagnosis not present

## 2017-11-27 DIAGNOSIS — R102 Pelvic and perineal pain: Secondary | ICD-10-CM | POA: Diagnosis present

## 2017-11-27 LAB — URINALYSIS, ROUTINE W REFLEX MICROSCOPIC
Bilirubin Urine: NEGATIVE
Glucose, UA: NEGATIVE mg/dL
KETONES UR: 80 mg/dL — AB
Nitrite: NEGATIVE
PROTEIN: 30 mg/dL — AB
Specific Gravity, Urine: 1.019 (ref 1.005–1.030)
pH: 5 (ref 5.0–8.0)

## 2017-11-27 LAB — WET PREP, GENITAL
CLUE CELLS WET PREP: NONE SEEN
Sperm: NONE SEEN
TRICH WET PREP: NONE SEEN
YEAST WET PREP: NONE SEEN

## 2017-11-27 LAB — PREGNANCY, URINE: Preg Test, Ur: NEGATIVE

## 2017-11-27 MED ORDER — AZITHROMYCIN 250 MG PO TABS
1000.0000 mg | ORAL_TABLET | Freq: Once | ORAL | Status: AC
  Administered 2017-11-27: 1000 mg via ORAL
  Filled 2017-11-27: qty 4

## 2017-11-27 MED ORDER — ACETAMINOPHEN 325 MG PO TABS
650.0000 mg | ORAL_TABLET | Freq: Once | ORAL | Status: AC
  Administered 2017-11-27: 650 mg via ORAL
  Filled 2017-11-27: qty 2

## 2017-11-27 MED ORDER — LIDOCAINE HCL (PF) 1 % IJ SOLN
INTRAMUSCULAR | Status: AC
  Filled 2017-11-27: qty 5

## 2017-11-27 MED ORDER — VALACYCLOVIR HCL 1 G PO TABS: 1000.0000 mg | ORAL_TABLET | Freq: Two times a day (BID) | ORAL | 0 refills | Status: AC

## 2017-11-27 MED ORDER — CEFTRIAXONE SODIUM 250 MG IJ SOLR
250.0000 mg | Freq: Once | INTRAMUSCULAR | Status: AC
  Administered 2017-11-27: 250 mg via INTRAMUSCULAR
  Filled 2017-11-27: qty 250

## 2017-11-27 MED ORDER — VALACYCLOVIR HCL 500 MG PO TABS
1000.0000 mg | ORAL_TABLET | Freq: Once | ORAL | Status: AC
  Administered 2017-11-27: 1000 mg via ORAL
  Filled 2017-11-27: qty 2

## 2017-11-27 NOTE — ED Notes (Signed)
Failed attempt to collect labs. RN notified °

## 2017-11-27 NOTE — ED Triage Notes (Signed)
Pt c/o labial and vaginal sores / blisters. Pt states severe vaginal pain. Reports that fiance was recently dx with STD.

## 2017-11-27 NOTE — ED Provider Notes (Signed)
[facility]-EMERGENCY DEPT Provider Note   CSN: [id number] Arrival date & time: 11/27/17  1213     History   Chief Complaint Chief Complaint  Patient presents with  . vaginal sores    HPI Emma Cantu is a 21 y.o. female.  HPI  21 year old female presents with concern for an STI.  She states over the last couple days she has been having "ulcers" that are painful over her labia.  There is some drainage but she is not sure if is coming from these ulcers or her vagina.  She states that when she wakes up there will be some discharge on her underwear.  She has had no new headaches or fevers.  She has not had any dysuria but whenever urine touches the source they burn.  No trouble urinating or abdominal pain.  She states her fianc told her he had similar sores and was diagnosed with a penile yeast infection a week or 2 ago and she has had intercourse with him since. Has been taking ibuprofen for pain, most recently before 12 pm today.  Past Medical History:  Diagnosis Date  . Nexplanon insertion 12/2012   Right Arm  . No pertinent past medical history     Patient Active Problem List   Diagnosis Date Noted  . Screen for STD (sexually transmitted disease) 05/23/2017  . Eczema 09/23/2016  . Contraception management 01/27/2013  . ALLERGIC RHINITIS 10/05/2010    Past Surgical History:  Procedure Laterality Date  . inguanal hernia repair    . NO PAST SURGERIES      OB History    Gravida Para Term Preterm AB Living   1 1 1  0 0 1   SAB TAB Ectopic Multiple Live Births   0 0 0 0 1       Home Medications    Prior to Admission medications   Medication Sig Start Date End Date Taking? Authorizing Provider  ibuprofen (ADVIL,MOTRIN) 200 MG tablet Take 200 mg by mouth every 6 (six) hours as needed for moderate pain.   Yes [provider]  hydrocortisone 2.5 % cream Apply topically 2 (two) times daily. Patient not taking: Reported on 11/27/2017  09/23/16   Renne MuscaWarden, Daniel L, MD  ibuprofen (ADVIL,MOTRIN) 600 MG tablet TAKE ONE TABLET BY MOUTH EVERY 6 HOURS Patient not taking: Reported on 11/27/2017 03/16/13   Andrena Mewsigby, Michael D, DO  metroNIDAZOLE (FLAGYL) 500 MG tablet Take 1 tablet (500 mg total) by mouth 2 (two) times daily. Patient not taking: Reported on 11/27/2017 05/23/17   Erasmo DownerBacigalupo, Angela M, MD  valACYclovir (VALTREX) 1000 MG tablet Take 1 tablet (1,000 mg total) by mouth 2 (two) times daily for 10 days. 11/27/17 12/07/17  Pricilla LovelessGoldston, Kinta Martis, MD    Family History Family History  Problem Relation Age of Onset  . Cancer Maternal Grandmother   . Cancer Maternal Grandfather   . Diabetes Paternal Grandmother   . Hyperlipidemia Mother   . Hypertension Mother   . Diabetes Paternal Grandfather     Social History Social History   Tobacco Use  . Smoking status: Never Smoker  Substance Use Topics  . Alcohol use: No  . Drug use: No     Allergies   Patient has no known allergies.   Review of Systems Review of Systems  Constitutional: Negative for fever.  Gastrointestinal: Negative for abdominal pain and vomiting.  Genitourinary: Positive for vaginal discharge and vaginal pain.  Skin: Positive for wound.  All other systems  reviewed and are negative.    Physical Exam Updated Vital Signs BP 128/71 (BP Location: Right Arm)   Pulse (!) 102   Temp 99.5 F (37.5 C) (Oral)   Resp 18   SpO2 97%   Physical Exam  Constitutional: She is oriented to person, place, and time. She appears well-developed and well-nourished.  HENT:  Head: Normocephalic and atraumatic.  Right Ear: External ear normal.  Left Ear: External ear normal.  Nose: Nose normal.  Eyes: Right eye exhibits no discharge. Left eye exhibits no discharge.  Pulmonary/Chest: Effort normal.  Abdominal: Soft. She exhibits no distension. There is no tenderness.  Genitourinary: There is lesion on the right labia. There is lesion on the left labia. Cervix exhibits  discharge and friability.  Genitourinary Comments: Small fluid filled blisters with erythematous base lining the medial portions of the labia bilaterally. Painful.  Neurological: She is alert and oriented to person, place, and time.  Skin: Skin is warm and dry.  Nursing note and vitals reviewed.    ED Treatments / Results  Labs (all labs ordered are listed, but only abnormal results are displayed) Labs Reviewed  WET PREP, GENITAL - Abnormal; Notable for the following components:      Result Value   WBC, Wet Prep HPF POC MANY (*)    All other components within normal limits  URINALYSIS, ROUTINE W REFLEX MICROSCOPIC - Abnormal; Notable for the following components:   APPearance HAZY (*)    Hgb urine dipstick MODERATE (*)    Ketones, ur 80 (*)    Protein, ur 30 (*)    Leukocytes, UA LARGE (*)    Bacteria, UA RARE (*)    Squamous Epithelial / LPF 6-30 (*)    All other components within normal limits  HSV CULTURE AND TYPING  PREGNANCY, URINE  RPR  HIV ANTIBODY (ROUTINE TESTING)  GC/CHLAMYDIA PROBE AMP ([place]    EKG  EKG Interpretation None       Radiology No results found.  Procedures Procedures (including critical care time)  Medications Ordered in ED Medications  acetaminophen (TYLENOL) tablet 650 mg (650 mg Oral Given 11/27/17 1843)  valACYclovir (VALTREX) tablet 1,000 mg (1,000 mg Oral Given 11/27/17 1843)  cefTRIAXone (ROCEPHIN) injection 250 mg (250 mg Intramuscular Given 11/27/17 1842)  azithromycin (ZITHROMAX) tablet 1,000 mg (1,000 mg Oral Given 11/27/17 1843)     Initial Impression / Assessment and Plan / ED Course  I have reviewed the triage vital signs and the nursing notes.  Pertinent labs & imaging results that were available during my care of the patient were reviewed by me and considered in my medical decision making (see chart for details).     Presentation concerning for primary genital herpes. Give valacyclovir x 10 days, f/u  with PCP. Cover for other STI with rocephin/azithro. No abd pain to suggest PID. Cervix abnormalities could be cervicitis or primary cervix issues, discussed need for f/u for pap testing. D/c home with return precautions, discussed need to tell partner.  Final Clinical Impressions(s) / ED Diagnoses   Final diagnoses:  Herpes simplex vulvovaginitis    ED Discharge Orders        Ordered    valACYclovir (VALTREX) 1000 MG tablet  2 times daily     11/27/17 [id number]       Zyshonne Malecha, MD 11/28/17 0100

## 2017-11-28 LAB — RPR: RPR Ser Ql: NONREACTIVE

## 2017-11-28 LAB — GC/CHLAMYDIA PROBE AMP (~~LOC~~) NOT AT ARMC
Chlamydia: NEGATIVE
Neisseria Gonorrhea: NEGATIVE

## 2017-11-28 LAB — HIV ANTIBODY (ROUTINE TESTING W REFLEX): HIV Screen 4th Generation wRfx: NONREACTIVE

## 2017-11-29 LAB — HSV CULTURE AND TYPING

## 2017-12-11 ENCOUNTER — Other Ambulatory Visit: Payer: Self-pay

## 2017-12-11 ENCOUNTER — Ambulatory Visit: Payer: Medicaid Other | Admitting: Internal Medicine

## 2017-12-11 ENCOUNTER — Encounter: Payer: Self-pay | Admitting: Internal Medicine

## 2017-12-11 DIAGNOSIS — A6000 Herpesviral infection of urogenital system, unspecified: Secondary | ICD-10-CM | POA: Insufficient documentation

## 2017-12-11 DIAGNOSIS — A6004 Herpesviral vulvovaginitis: Secondary | ICD-10-CM

## 2017-12-11 MED ORDER — VALACYCLOVIR HCL 1 G PO TABS: 1000.0000 mg | ORAL_TABLET | Freq: Two times a day (BID) | ORAL | 0 refills | Status: DC

## 2017-12-11 NOTE — Progress Notes (Signed)
   Subjective:   Patient: Emma Cantu       Birthdate: 02/20/1996       MRN: [id number]      HPI  Emma Cantu is a 21 y.o. female presenting for same day appt for herpes outbreak.   Genital herpes Patient says she was diagnosed with genital herpes earliest this month. She was seen in ED on 12/03, and was prescribed 10d course of Valtrex BID at that time for primary outbreak. She developed flu-like symptoms prior to appearance of herpetic lesions, and did not present to ED until two days after noticing lesions.  Patient completed the course of Valtrex 5-6 days ago, but is beginning to have worsening tingling and discomfort vaginally. Says that she continued to have tingling sensation even while taking Valtrex, however did not have discomfort, and the tingling has increased in severity. Has not noticed any lesions currently.  Patient is very tearful throughout encounter, and is wanting to speak with someone. She is wondering if there are any genital herpes support groups that she can join. Her sister is with her today to provide moral support.   Smoking status reviewed. Patient is never smoker.   Review of Systems See HPI.     Objective:  Physical Exam  Constitutional: She is oriented to person, place, and time and well-developed, well-nourished, and in no distress.  HENT:  Head: Normocephalic and atraumatic.  Eyes: Conjunctivae and EOM are normal. Right eye exhibits no discharge. Left eye exhibits no discharge.  Pulmonary/Chest: Effort normal. No respiratory distress.  Neurological: She is alert and oriented to person, place, and time.  Psychiatric:  Tearful throughout encounter      Assessment & Plan:  Genital herpes Diagnosed 12/03. Primary outbreak treated with 10d course of Valtrex. Symptoms are persisting despite completing initial course of Valtrex. As such, will prescribe additional 10d course of Valtrex BID. If still symptomatic after completion of this course, would  consider daily suppressive therapy.  Of note, patient very distraught about this diagnosis and wishing to speak with someone. Encouraged her to seek out genital herpes support groups in the area, as she is particularly interested in this. Also feel that patient could benefit from discussing possible coping techniques. [facility]. Would possibly benefit from one or two session to discuss coping techniques and identify what exactly is causing such distress.    Tarri AbernethyAbigail J Raidon Swanner, MD, MPH PGY-3 [facility] Pager [phone]

## 2017-12-11 NOTE — Patient Instructions (Addendum)
It was nice meeting you today Danaka!  Please take valtrex one tablet twice a day for the next 10 days.   If you are still having tingling and/or pain at the end of the 10 day course, please let us know and we can discuss beginning daily maintenance therapy.   To meet with one of our behavioral health counselors, please contact Dr. Pascal LuxKane at the number provided on her business card.   If you have any questions or concerns, please feel free to call the clinic.   Be well,  Dr. Natale MilchLancaster

## 2017-12-11 NOTE — Assessment & Plan Note (Signed)
Diagnosed 12/03. Primary outbreak treated with 10d course of Valtrex. Symptoms are persisting despite completing initial course of Valtrex. As such, will prescribe additional 10d course of Valtrex BID. If still symptomatic after completion of this course, would consider daily suppressive therapy.  Of note, patient very distraught about this diagnosis and wishing to speak with someone. Encouraged her to seek out genital herpes support groups in the area, as she is particularly interested in this. Also feel that patient could benefit from discussing possible coping techniques. [facility]. Would possibly benefit from one or two session to discuss coping techniques and identify what exactly is causing such distress.

## 2017-12-21 ENCOUNTER — Telehealth: Payer: Self-pay | Admitting: Family Medicine

## 2017-12-22 ENCOUNTER — Encounter: Payer: Self-pay | Admitting: Internal Medicine

## 2017-12-22 ENCOUNTER — Other Ambulatory Visit: Payer: Self-pay

## 2017-12-22 ENCOUNTER — Ambulatory Visit: Payer: Medicaid Other | Admitting: Internal Medicine

## 2017-12-22 VITALS — BP 110/74 | HR 85 | Temp 99.2°F | Ht 64.0 in | Wt 208.4 lb

## 2017-12-22 DIAGNOSIS — A6004 Herpesviral vulvovaginitis: Secondary | ICD-10-CM

## 2017-12-22 DIAGNOSIS — R350 Frequency of micturition: Secondary | ICD-10-CM | POA: Insufficient documentation

## 2017-12-22 DIAGNOSIS — L2082 Flexural eczema: Secondary | ICD-10-CM | POA: Diagnosis not present

## 2017-12-22 LAB — POCT URINALYSIS DIP (MANUAL ENTRY)
Blood, UA: NEGATIVE
GLUCOSE UA: NEGATIVE mg/dL
Nitrite, UA: NEGATIVE
Protein Ur, POC: 100 mg/dL — AB
Urobilinogen, UA: 1 E.U./dL
pH, UA: 6 (ref 5.0–8.0)

## 2017-12-22 LAB — POCT UA - MICROSCOPIC ONLY

## 2017-12-22 MED ORDER — SULFAMETHOXAZOLE-TRIMETHOPRIM 800-160 MG PO TABS: 1.0000 | ORAL_TABLET | Freq: Two times a day (BID) | ORAL | 0 refills | Status: DC

## 2017-12-22 MED ORDER — VALACYCLOVIR HCL 500 MG PO TABS: 500.0000 mg | ORAL_TABLET | Freq: Every day | ORAL | 1 refills | Status: DC

## 2017-12-22 NOTE — Patient Instructions (Signed)
It was nice seeing you again today Emma Cantu!  Please begin taking Bactrim (antibiotic) one tablet twice daily for the next three days.   Please continue taking Valtrex (valacyclovir), however take only one 500 mg tablet daily for the next month. I will see you again at that time to determine if we need to continue the medication or not.   For eczema, please begin using a thick moisturizer such as Vaseline at least twice daily, especially after showers. You can apply the Vaseline or similar moisturizer as many times a day as you want to dry areas.   If you have any questions or concerns, please feel free to call the clinic.   Be well,  Dr. Natale MilchLancaster

## 2017-12-22 NOTE — Progress Notes (Signed)
   Subjective:   Patient: Emma Cantu       Birthdate: 09/17/1996       MRN: [id number]      HPI  Emma Cantu is a 21 y.o. female presenting for f/u of genital herpes. Also to discuss increased urinary frequency and eczema.    Genital herpes Diagnosed 12/03. Symptoms persisted despite initial course of Valtrex. At last visit on 12/17, was prescribed additional 10d course of Valtrex. Patient reports that tingling is still present. Denies pain, just tingling. No lesions currently. Completed medication course two days ago as scheduled. Is concerned that if she is not on any medication she will have another outbreak.   Increased urinary frequency Was diagnosed with UTI when she presented to hospital on 12/03. Asymptomatic then. Was given course of antibiotics at that time which she completed. Now has increased urinary frequency. Denies dysuria. Says it feels as though she cannot fully empty her bladder. Sometimes she only urinates a small amount despite feeling as if she needs to urinate more. Also says her urine is darker than normal, and she has been drinking more water. Endorses some abdominal discomfort. Denies N/V, fevers. Denies hematuria.   Eczema Not a new issue, however has been worsening recently. Was previously using a prescription cream, however stopped needing it and has since misplaced the tube. Is currently not using any type of moisturizer on dry areas. Present primarily on upper extremities, especially hands.   Smoking status reviewed. Patient is never smoker.   Review of Systems See HPI.     Objective:  Physical Exam  Constitutional: She is oriented to person, place, and time and well-developed, well-nourished, and in no distress.  HENT:  Head: Normocephalic and atraumatic.  Eyes: Conjunctivae and EOM are normal. Right eye exhibits no discharge. Left eye exhibits no discharge.  Pulmonary/Chest: Effort normal. No respiratory distress.  Neurological: She is alert and  oriented to person, place, and time.  Skin: Skin is warm and dry.  Dry scaly patches consistent with eczema on hands near knuckles bilaterally  Psychiatric: Affect and judgment normal.      Assessment & Plan:  Eczema Currently not using any type of moisturizer. As such, will start treatment with OTC emollient, such as Vaseline. Can resume hydrocortisone if necessary at f/u appt in one month.  - Vaseline or similar emollient at least twice daily, especially after showering   Genital herpes Still symptomatic despite additional 10d course of Valtrex, however no recurrence of lesions. Will begin suppressive therapy today, however will reevaluate after one month. As patient is only having tingling and no recurrence of lesions, and as she has never had a severe outbreak, feel that waiting to reevaluate after one year as recommended by UpToDate is unnecessary, thus will reevaluate sooner. Patient agreeable to this plan.  - Begin Valtrex 500mg  qd x1 mo - F/u in 1 mo. If asymptomatic at that time, can discontinue suppressive treatment   Increased frequency of urination Cloudy urine with moderate leuks. As patient having symptoms, will treat with 3d course of Bactrim.    Tarri AbernethyAbigail J Seleena Reimers, MD, MPH PGY-3 [facility] Pager [phone]

## 2017-12-22 NOTE — Assessment & Plan Note (Signed)
Still symptomatic despite additional 10d course of Valtrex, however no recurrence of lesions. Will begin suppressive therapy today, however will reevaluate after one month. As patient is only having tingling and no recurrence of lesions, and as she has never had a severe outbreak, feel that waiting to reevaluate after one year as recommended by UpToDate is unnecessary, thus will reevaluate sooner. Patient agreeable to this plan.  - Begin Valtrex 500mg  qd x1 mo - F/u in 1 mo. If asymptomatic at that time, can discontinue suppressive treatment

## 2017-12-22 NOTE — Assessment & Plan Note (Signed)
Currently not using any type of moisturizer. As such, will start treatment with OTC emollient, such as Vaseline. Can resume hydrocortisone if necessary at f/u appt in one month.  - Vaseline or similar emollient at least twice daily, especially after showering

## 2017-12-22 NOTE — Assessment & Plan Note (Signed)
Cloudy urine with moderate leuks. As patient having symptoms, will treat with 3d course of Bactrim.

## 2018-01-29 ENCOUNTER — Emergency Department (HOSPITAL_COMMUNITY)
Admission: EM | Admit: 2018-01-29 | Discharge: 2018-01-29 | Disposition: A | Discharge status: Home / Self Care | Payer: Medicaid Other | Attending: Emergency Medicine | Admitting: Emergency Medicine

## 2018-01-29 ENCOUNTER — Encounter (HOSPITAL_COMMUNITY): Payer: Self-pay | Admitting: Emergency Medicine

## 2018-01-29 DIAGNOSIS — B9789 Other viral agents as the cause of diseases classified elsewhere: Secondary | ICD-10-CM | POA: Diagnosis not present

## 2018-01-29 DIAGNOSIS — J029 Acute pharyngitis, unspecified: Secondary | ICD-10-CM | POA: Diagnosis present

## 2018-01-29 DIAGNOSIS — J069 Acute upper respiratory infection, unspecified: Secondary | ICD-10-CM | POA: Diagnosis not present

## 2018-01-29 LAB — RAPID STREP SCREEN (MED CTR MEBANE ONLY): Streptococcus, Group A Screen (Direct): NEGATIVE

## 2018-01-29 NOTE — ED Provider Notes (Signed)
[facility]-EMERGENCY DEPT Provider Note   CSN: [id number] Arrival date & time: 01/29/18  0845     History   Chief Complaint Chief Complaint  Patient presents with  . Sore Throat    HPI Emma Cantu is a 22 y.o. female.  The history is provided by the patient.  Sore Throat  This is a new problem. Episode onset: 3 days. The problem occurs constantly. The problem has not changed since onset.Associated symptoms comments: Cough, congestion, sore throat, chills.  No shortness of breath, chest pain or abdominal pain.. The symptoms are aggravated by swallowing. Nothing relieves the symptoms. She has tried acetaminophen for the symptoms. The treatment provided no relief.    Past Medical History:  Diagnosis Date  . Nexplanon insertion 12/2012   Right Arm  . No pertinent past medical history     Patient Active Problem List   Diagnosis Date Noted  . Increased frequency of urination 12/22/2017  . Genital herpes 12/11/2017  . Screen for STD (sexually transmitted disease) 05/23/2017  . Eczema 09/23/2016  . Contraception management 01/27/2013  . ALLERGIC RHINITIS 10/05/2010    Past Surgical History:  Procedure Laterality Date  . inguanal hernia repair    . NO PAST SURGERIES      OB History    Gravida Para Term Preterm AB Living   1 1 1  0 0 1   SAB TAB Ectopic Multiple Live Births   0 0 0 0 1       Home Medications    Prior to Admission medications   Medication Sig Start Date End Date Taking? Authorizing Provider  acetaminophen (TYLENOL) 500 MG tablet Take 1,000 mg by mouth every 6 (six) hours as needed for mild pain.   Yes [provider]  ibuprofen (ADVIL,MOTRIN) 200 MG tablet Take 200 mg by mouth every 6 (six) hours as needed for headache.    Yes [provider]  valACYclovir (VALTREX) 500 MG tablet Take 1 tablet (500 mg total) by mouth daily. 12/22/17  Yes Marquette SaaLancaster, Abigail Joseph, MD  sulfamethoxazole-trimethoprim (BACTRIM  DS,SEPTRA DS) 800-160 MG tablet Take 1 tablet by mouth 2 (two) times daily. Patient not taking: Reported on 01/29/2018 12/22/17   Marquette SaaLancaster, Abigail Joseph, MD    Family History Family History  Problem Relation Age of Onset  . Cancer Maternal Grandmother   . Cancer Maternal Grandfather   . Diabetes Paternal Grandmother   . Hyperlipidemia Mother   . Hypertension Mother   . Diabetes Paternal Grandfather     Social History Social History   Tobacco Use  . Smoking status: Never Smoker  . Smokeless tobacco: Never Used  Substance Use Topics  . Alcohol use: No  . Drug use: No     Allergies   Patient has no known allergies.   Review of Systems Review of Systems  All other systems reviewed and are negative.    Physical Exam Updated Vital Signs BP (!) 137/94 (BP Location: Left Arm)   Pulse 91   Temp 99.5 F (37.5 C) (Oral)   Resp 16   Ht 5\' 4"  (1.626 m)   Wt 94.3 kg (208 lb)   LMP 01/29/2018   SpO2 96%   BMI 35.70 kg/m   Physical Exam  Constitutional: She is oriented to person, place, and time. She appears well-developed and well-nourished. No distress.  HENT:  Head: Normocephalic and atraumatic.  Right Ear: Tympanic membrane normal.  Left Ear: Tympanic membrane normal.  Mouth/Throat: No uvula swelling.  Posterior oropharyngeal erythema present. No oropharyngeal exudate or posterior oropharyngeal edema.  Eyes: Conjunctivae and EOM are normal. Pupils are equal, round, and reactive to light.  Neck: Normal range of motion. Neck supple.  Cardiovascular: Normal rate, regular rhythm and intact distal pulses.  No murmur heard. Pulmonary/Chest: Effort normal and breath sounds normal. No respiratory distress. She has no wheezes. She has no rales.  Musculoskeletal: Normal range of motion. She exhibits no edema or tenderness.  Lymphadenopathy:    She has no cervical adenopathy.  Neurological: She is alert and oriented to person, place, and time.  Skin: Skin is warm and dry.  No rash noted. No erythema.  Psychiatric: She has a normal mood and affect. Her behavior is normal.  Nursing note and vitals reviewed.    ED Treatments / Results  Labs (all labs ordered are listed, but only abnormal results are displayed) Labs Reviewed  RAPID STREP SCREEN (NOT AT [facility])  CULTURE, GROUP A STREP [facility])    EKG  EKG Interpretation None       Radiology No results found.  Procedures Procedures (including critical care time)  Medications Ordered in ED Medications - No data to display   Initial Impression / Assessment and Plan / ED Course  I have reviewed the triage vital signs and the nursing notes.  Pertinent labs & imaging results that were available during my care of the patient were reviewed by me and considered in my medical decision making (see chart for details).     Pt with symptoms consistent with viral URI.  Well appearing here.  No signs of breathing difficulty  No signs of pharyngitis, otitis or abnormal abdominal findings.   strep wnl and pt to return with any further problems.   Final Clinical Impressions(s) / ED Diagnoses   Final diagnoses:  Viral upper respiratory tract infection    ED Discharge Orders    None       Gwyneth Sprout, MD 01/29/18 1010

## 2018-01-29 NOTE — ED Triage Notes (Signed)
Per pt, states scratchy throat that started on Saturday-congestion and cough-unsure of fever-took Tylenol prior to coming to ED-states pain has eased up

## 2018-01-31 ENCOUNTER — Ambulatory Visit: Payer: Medicaid Other | Admitting: Internal Medicine

## 2018-01-31 LAB — CULTURE, GROUP A STREP (THRC)

## 2018-02-21 ENCOUNTER — Ambulatory Visit: Payer: Medicaid Other | Admitting: Internal Medicine

## 2018-02-21 ENCOUNTER — Other Ambulatory Visit: Payer: Self-pay

## 2018-02-21 ENCOUNTER — Encounter: Payer: Self-pay | Admitting: Internal Medicine

## 2018-02-21 DIAGNOSIS — A6004 Herpesviral vulvovaginitis: Secondary | ICD-10-CM

## 2018-02-21 MED ORDER — VALACYCLOVIR HCL 500 MG PO TABS: 500.0000 mg | ORAL_TABLET | Freq: Every day | ORAL | 2 refills | Status: DC

## 2018-02-21 NOTE — Assessment & Plan Note (Signed)
Return of symptoms, including vaginal lesion, after two days off Valtrex. Quick improvement after resuming Valtrex. As such, will continue daily maintenance therapy. Per UpToDate recommendations, will reevaluate in one year (11/2018), and wean to PRN treatment if able. Prescription written for one year of Valtrex.

## 2018-02-21 NOTE — Patient Instructions (Signed)
It was nice seeing you again today Emma Cantu!  Please continue taking Valtrex daily as you have been. We will re-evaluate things next December to see if we can discontinue the daily medication. If you start to have flares despite taking the medication, please let met know.   If you have any questions or concerns, please feel free to call the clinic.   Be well,  Dr. Avon Gully

## 2018-02-21 NOTE — Progress Notes (Signed)
   Subjective:   Patient: Emma Cantu       Birthdate: 11/23/1996       MRN: [id number]      HPI  Emma Cantu is a 22 y.o. female presenting for f/u of genital herpes.   Genital herpes Last seen for this issue on 12/28. At that time, started on daily maintenance Valtrex, as no resolution of symptoms after two initial treatment regimens. Says that symptoms had resolved until she ran out of Valtrex earlier this month. Missed two days of pills, then noticed tingling and itching, and a single vaginal lesion. Picked up prescription refill and symptoms improved. Does not have any tingling or itching if taking Valtrex. Would like to continue on maintenance therapy at this time, as she is worried if she stops taking it, her symptoms will return again.   Smoking status reviewed. Patient is never smoker.   Review of Systems See HPI.     Objective:  Physical Exam  Constitutional: She is oriented to person, place, and time and well-developed, well-nourished, and in no distress.  HENT:  Head: Normocephalic and atraumatic.  Pulmonary/Chest: Effort normal. No respiratory distress.  Neurological: She is alert and oriented to person, place, and time.  Skin: Skin is warm and dry.  Psychiatric: Affect and judgment normal.      Assessment & Plan:  Genital herpes Return of symptoms, including vaginal lesion, after two days off Valtrex. Quick improvement after resuming Valtrex. As such, will continue daily maintenance therapy. Per UpToDate recommendations, will reevaluate in one year (11/2018), and wean to PRN treatment if able. Prescription written for one year of Valtrex.    Tarri AbernethyAbigail J Nhia Heaphy, MD, MPH PGY-3 [facility] Pager [phone]

## 2018-03-12 ENCOUNTER — Encounter: Payer: Self-pay | Admitting: Internal Medicine

## 2018-03-12 ENCOUNTER — Ambulatory Visit: Payer: Medicaid Other | Admitting: Internal Medicine

## 2018-03-12 ENCOUNTER — Other Ambulatory Visit: Payer: Self-pay

## 2018-03-12 VITALS — BP 110/68 | HR 72 | Temp 99.0°F | Ht 64.0 in | Wt 211.6 lb

## 2018-03-12 DIAGNOSIS — R350 Frequency of micturition: Secondary | ICD-10-CM

## 2018-03-12 LAB — POCT URINALYSIS DIP (MANUAL ENTRY)
BILIRUBIN UA: NEGATIVE
BILIRUBIN UA: NEGATIVE mg/dL
Blood, UA: NEGATIVE
GLUCOSE UA: NEGATIVE mg/dL
NITRITE UA: NEGATIVE
PH UA: 6 (ref 5.0–8.0)
Protein Ur, POC: NEGATIVE mg/dL
Spec Grav, UA: 1.025 (ref 1.010–1.025)
Urobilinogen, UA: 0.2 E.U./dL

## 2018-03-12 LAB — POCT UA - MICROSCOPIC ONLY

## 2018-03-12 NOTE — Assessment & Plan Note (Signed)
UA with only trace leuks and no other signs of infection. Abd exam NL with no suprapubic tenderness. Given UA and patient denying dysuria or hematuria, will not treat as UTI. Sounds like patient is drinking a lot of fluid, which is likely cause of increased urination. Encouraged her to drink primarily water and decrease amount of cranberry juice cocktail and sodas. Also discussed that cranberry juice cocktail is not effective UTI prophylaxis. Instructed to call if dysuria or hematuria occurs, and will consider calling in abx at that time.

## 2018-03-12 NOTE — Patient Instructions (Addendum)
It was nice seeing you again today Emma Cantu!  There are no signs of infection of your urine sample. If you starts to develop pain or burning when you pee, or notice blood in your urine, please call to let me know, and I can send in an antibiotic. In the meantime, make sure you are drinking mostly water.   For cough and sore throat, you can eat a teaspoonful of honey, either alone or mixed into a warm beverage, 3-4 times a day, especially before bed. You can also take ibuprofen or Tylenol as needed for sore throat, body aches, and/or fevers.   If you have any questions or concerns, please feel free to call the clinic.   Be well,  Dr. Natale MilchLancaster

## 2018-03-12 NOTE — Progress Notes (Signed)
   Subjective:   Patient: Emma Cantu       Birthdate: 09/10/1996       MRN: [id number]      HPI  Emma Cantu is a 22 y.o. female presenting for same day clinic for increased urinary frequency Emma cold symptoms.   Increased urinary frequency Recurrent issue for patient. Last diagnosed with UTI three months ago. Says that currently Emma has had increased urinary frequency for the past week, typically urinating about 4 times in 2 hours. Denies dysuria, hematuria, vaginal itching or burning. Says urine in light in color Emma does not have an abnormal odor. Denies abd pain, N/V, fevers. Has been drinking cranberry juice cocktail, sodas, Emma water. Says Emma does not think Emma has been drinking more than usual.   Cold symptoms Began about two days ago. Minor cough with no sputum production. Scratchy throat, nasal congestion. No nasal congestion. No fevers. Has taken ibuprofen Emma Benadryl, as Emma thought allergies might be cause of her symptoms, but did not make a difference.   Smoking status reviewed. Patient is never smoker.   Review of Systems See HPI.     Objective:  Physical Exam  Constitutional: Emma Cantu, Emma Cantu, Emma Cantu Emma well-developed, well-nourished, Emma in no distress.  HENT:  Head: Normocephalic Emma atraumatic.  Nose: Nose normal.  Mouth/Throat: Oropharynx is clear Emma moist. No oropharyngeal exudate.  Eyes: Conjunctivae Emma EOM are normal. Pupils are equal, round, Emma reactive to light. Right eye exhibits no discharge. Left eye exhibits no discharge.  Neck: Normal range of motion. Neck supple.  Cardiovascular: Normal rate, regular rhythm Emma normal heart sounds.  No murmur heard. Pulmonary/Chest: Effort normal Emma breath sounds normal. No respiratory distress. Emma has no wheezes.  Abdominal: Soft. Bowel sounds are normal. Emma exhibits no distension. There is no tenderness.  Lymphadenopathy:    Emma has no cervical adenopathy.  Neurological: Emma is alert  Emma oriented to Cantu, Emma Cantu, Emma Cantu.  Skin: Skin is warm Emma dry.  Psychiatric: Affect Emma judgment normal.      Assessment & Plan:  Increased frequency of urination UA with only trace leuks Emma no other signs of infection. Abd exam NL with no suprapubic tenderness. Given UA Emma patient denying dysuria or hematuria, will not treat as UTI. Sounds like patient is drinking a lot of fluid, which is likely cause of increased urination. Encouraged her to drink primarily water Emma decrease amount of cranberry juice cocktail Emma sodas. Also discussed that cranberry juice cocktail is not effective UTI prophylaxis. Instructed to call if dysuria or hematuria occurs, Emma will consider calling in abx at that Cantu.   URI Likely viral etiology. Lungs CTAB, no oropharyngeal erythema or exudates, Emma patient afebrile Emma generally well-appearing. Possibly allergic component, however patient with no history of allergies Emma symptoms not entirely consistent with this. Will treat conservatively for now with honey for cough Emma ibuprofen/Tylenol for sore throat Emma/or fevers. If symptoms persist, could consider Zyrtec.   Tarri AbernethyAbigail J Lancaster, MD, MPH PGY-3 [facility] Pager [phone]

## 2018-03-21 ENCOUNTER — Encounter (HOSPITAL_COMMUNITY): Payer: Self-pay | Admitting: Emergency Medicine

## 2018-03-21 DIAGNOSIS — R05 Cough: Secondary | ICD-10-CM | POA: Diagnosis present

## 2018-03-21 DIAGNOSIS — Z5321 Procedure and treatment not carried out due to patient leaving prior to being seen by health care provider: Secondary | ICD-10-CM | POA: Diagnosis not present

## 2018-03-21 NOTE — ED Triage Notes (Signed)
Pt states she has had a "harsh" cough for the past 2.5 weeks  Pt has used cough drops, dayquil, nyquil, and mucinex without relief  Pt states cough is productive with yellow or white sputum

## 2018-03-22 ENCOUNTER — Emergency Department (HOSPITAL_COMMUNITY)
Admission: EM | Admit: 2018-03-22 | Discharge: 2018-03-22 | Disposition: A | Discharge status: Home / Self Care | Payer: Medicaid Other | Attending: Emergency Medicine | Admitting: Emergency Medicine

## 2018-03-22 HISTORY — DX: Herpesviral infection, unspecified: B00.9

## 2018-03-22 NOTE — ED Notes (Signed)
Pt called from the lobby with no response x3 

## 2018-03-22 NOTE — ED Notes (Signed)
Pt did not come when called from lobby.  

## 2018-11-12 ENCOUNTER — Other Ambulatory Visit: Payer: Self-pay | Admitting: Family Medicine

## 2018-11-12 NOTE — Telephone Encounter (Signed)
Pt is calling and would like to have her Valtrex refilled. Pt would like to be called when this is sent to her pharmacy. The best call back number is [phone].

## 2018-11-14 ENCOUNTER — Other Ambulatory Visit: Payer: Self-pay | Admitting: Family Medicine

## 2018-11-14 DIAGNOSIS — A6000 Herpesviral infection of urogenital system, unspecified: Secondary | ICD-10-CM

## 2018-11-14 MED ORDER — VALACYCLOVIR HCL 500 MG PO TABS: 500.0000 mg | ORAL_TABLET | Freq: Every day | ORAL | 2 refills | Status: DC

## 2018-11-14 NOTE — Telephone Encounter (Signed)
Prescription refilled. Please advise.  Durward Parcelavid McMullen, DO [facility], PGY-3

## 2018-11-14 NOTE — Telephone Encounter (Signed)
Informed patient of RX.  .Simpson, [name], CMA  

## 2019-01-06 ENCOUNTER — Emergency Department (HOSPITAL_COMMUNITY)
Admission: EM | Admit: 2019-01-06 | Discharge: 2019-01-06 | Disposition: A | Discharge status: Home / Self Care | Payer: BLUE CROSS/BLUE SHIELD | Attending: Emergency Medicine | Admitting: Emergency Medicine

## 2019-01-06 ENCOUNTER — Emergency Department (HOSPITAL_COMMUNITY): Payer: BLUE CROSS/BLUE SHIELD

## 2019-01-06 ENCOUNTER — Encounter (HOSPITAL_COMMUNITY): Payer: Self-pay

## 2019-01-06 ENCOUNTER — Other Ambulatory Visit: Payer: Self-pay

## 2019-01-06 DIAGNOSIS — S99911A Unspecified injury of right ankle, initial encounter: Secondary | ICD-10-CM | POA: Diagnosis present

## 2019-01-06 DIAGNOSIS — W010XXA Fall on same level from slipping, tripping and stumbling without subsequent striking against object, initial encounter: Secondary | ICD-10-CM | POA: Insufficient documentation

## 2019-01-06 DIAGNOSIS — Y929 Unspecified place or not applicable: Secondary | ICD-10-CM | POA: Insufficient documentation

## 2019-01-06 DIAGNOSIS — Z79899 Other long term (current) drug therapy: Secondary | ICD-10-CM | POA: Diagnosis not present

## 2019-01-06 DIAGNOSIS — Y9389 Activity, other specified: Secondary | ICD-10-CM | POA: Diagnosis not present

## 2019-01-06 DIAGNOSIS — S82851A Displaced trimalleolar fracture of right lower leg, initial encounter for closed fracture: Secondary | ICD-10-CM | POA: Insufficient documentation

## 2019-01-06 DIAGNOSIS — Y999 Unspecified external cause status: Secondary | ICD-10-CM | POA: Diagnosis not present

## 2019-01-06 MED ORDER — PROPOFOL 10 MG/ML IV BOLUS
INTRAVENOUS | Status: AC | PRN
  Administered 2019-01-06: 45 mg via INTRAVENOUS
  Administered 2019-01-06 (×2): 25 mg via INTRAVENOUS

## 2019-01-06 MED ORDER — HYDROCODONE-ACETAMINOPHEN 5-325 MG PO TABS: 1.0000 | ORAL_TABLET | Freq: Four times a day (QID) | ORAL | 0 refills | Status: DC | PRN

## 2019-01-06 MED ORDER — ONDANSETRON HCL 4 MG/2ML IJ SOLN
4.0000 mg | Freq: Once | INTRAMUSCULAR | Status: AC
  Administered 2019-01-06: 4 mg via INTRAVENOUS
  Filled 2019-01-06: qty 2

## 2019-01-06 MED ORDER — MORPHINE SULFATE (PF) 4 MG/ML IV SOLN
4.0000 mg | Freq: Once | INTRAVENOUS | Status: AC
  Administered 2019-01-06: 4 mg via INTRAVENOUS
  Filled 2019-01-06: qty 1

## 2019-01-06 MED ORDER — PROPOFOL 10 MG/ML IV BOLUS
0.5000 mg/kg | Freq: Once | INTRAVENOUS | Status: AC
  Administered 2019-01-06: 1 mg via INTRAVENOUS
  Filled 2019-01-06: qty 20

## 2019-01-06 MED ORDER — SODIUM CHLORIDE 0.9 % IV SOLN
INTRAVENOUS | Status: AC | PRN
  Administered 2019-01-06: 1000 mL via INTRAVENOUS

## 2019-01-06 MED ORDER — FENTANYL CITRATE (PF) 100 MCG/2ML IJ SOLN
INTRAMUSCULAR | Status: AC
  Administered 2019-01-06: 50 ug
  Filled 2019-01-06: qty 2

## 2019-01-06 MED ORDER — IBUPROFEN 600 MG PO TABS: 600.0000 mg | ORAL_TABLET | Freq: Four times a day (QID) | ORAL | 0 refills | Status: DC | PRN

## 2019-01-06 MED ORDER — FENTANYL CITRATE (PF) 100 MCG/2ML IJ SOLN
INTRAMUSCULAR | Status: AC | PRN
  Administered 2019-01-06: 50 ug via INTRAVENOUS

## 2019-01-06 NOTE — ED Triage Notes (Signed)
Pt BIBA from home. Pt slipped in water on driveway and felt right ankle pop. Swelling on medial side.  + pulse. Ice placed with EMS.

## 2019-01-06 NOTE — Discharge Instructions (Signed)
You have been seen today for an ankle injury.  Antiinflammatory medications: Take 600 mg of ibuprofen every 6 hours or 440 mg (over the counter dose) to 500 mg (prescription dose) of naproxen every 12 hours for the next 3 days. After this time, these medications may be used as needed for pain. Take these medications with food to avoid upset stomach. Choose only one of these medications, do not take them together. Acetaminophen (generic for Tylenol): Should you continue to have additional pain while taking the ibuprofen or naproxen, you may add in acetaminophen as needed. Your daily total maximum amount of acetaminophen from all sources should be limited to 4000mg /day for persons without liver problems, or 2000mg /day for those with liver problems. Vicodin: May take Vicodin (hydrocodone-acetaminophen) as needed for severe pain.  Do not drive or perform other dangerous activities while taking the Vicodin.  Please note that each pill of Vicodin contains 325 mg of acetaminophen (Tylenol) and the above dosage limits apply. Ice: May apply ice to the area over the next 24 hours for 15 minutes at a time to reduce swelling. Elevation: Keep the extremity elevated as often as possible to reduce pain and inflammation. Support: Keep the splint clean and dry.  You will be nonweightbearing until told otherwise by the orthopedic specialist. Follow up: Follow-up with the orthopedic specialist.  Call the number provided to set up an appointment. Return: Return to the ED for numbness, weakness, increasing pain, overall worsening symptoms, loss of function, or if symptoms are not improving, you have tried to follow up with the orthopedic specialist, and have been unable to do so.  For prescription assistance, may try using prescription discount sites or apps, such as goodrx.com

## 2019-01-06 NOTE — ED Provider Notes (Signed)
[facility]-EMERGENCY DEPT Provider Note   CSN: [id number] Arrival date & time: 01/06/19  1840     History   Chief Complaint No chief complaint on file.   HPI Emma Cantu is a 23 y.o. female.  HPI   Emma Cantu is a 23 y.o. female, patient with no pertinent past medical history, presenting to the ED with right ankle injury that occurred shortly prior to arrival.  Patient sustained a slip and fall.  She did hit her head. Right ankle pain is throbbing, severe, nonradiating.  Patient last ate around 4 PM. Denies LOC, nausea/vomiting, neuro deficits, chest pain, abdominal pain, neck/back pain, headache, or any other complaints.  Past Medical History:  Diagnosis Date  . Herpes   . Nexplanon insertion 12/2012   Right Arm  . No pertinent past medical history     Patient Active Problem List   Diagnosis Date Noted  . Increased frequency of urination 12/22/2017  . Genital herpes 12/11/2017  . Screen for STD (sexually transmitted disease) 05/23/2017  . Eczema 09/23/2016  . Contraception management 01/27/2013  . ALLERGIC RHINITIS 10/05/2010    Past Surgical History:  Procedure Laterality Date  . HERNIA REPAIR    . inguanal hernia repair    . NO PAST SURGERIES       OB History    Gravida  1   Para  1   Term  1   Preterm  0   AB  0   Living  1     SAB  0   TAB  0   Ectopic  0   Multiple  0   Live Births  1            Home Medications    Prior to Admission medications   Medication Sig Start Date End Date Taking? Authorizing Provider  etonogestrel (NEXPLANON) 68 MG IMPL implant 1 each by Subdermal route once.   Yes [provider]  ibuprofen (ADVIL,MOTRIN) 200 MG tablet Take 800 mg by mouth every 6 (six) hours as needed.   Yes [provider]  valACYclovir (VALTREX) 500 MG tablet Take 1 tablet (500 mg total) by mouth daily. 11/14/18  Yes Wendee BeaversMcMullen, David J, DO  HYDROcodone-acetaminophen (NORCO/VICODIN)  5-325 MG tablet Take 1-2 tablets by mouth every 6 (six) hours as needed for severe pain. 01/06/19   Mallery Harshman C, PA-C  ibuprofen (ADVIL,MOTRIN) 600 MG tablet Take 1 tablet (600 mg total) by mouth every 6 (six) hours as needed. 01/06/19   Jaylnn Ullery, Hillard DankerShawn C, PA-C    Family History Family History  Problem Relation Age of Onset  . Cancer Maternal Grandmother   . Cancer Maternal Grandfather   . Diabetes Paternal Grandmother   . Hyperlipidemia Mother   . Hypertension Mother   . Diabetes Paternal Grandfather   . Stroke Other     Social History Social History   Tobacco Use  . Smoking status: Never Smoker  . Smokeless tobacco: Never Used  Substance Use Topics  . Alcohol use: No  . Drug use: No     Allergies   Patient has no known allergies.   Review of Systems Review of Systems  Constitutional: Negative for fever.  Respiratory: Negative for shortness of breath.   Cardiovascular: Negative for chest pain.  Gastrointestinal: Negative for nausea and vomiting.  Musculoskeletal: Positive for arthralgias and joint swelling. Negative for back pain and neck pain.  All other systems reviewed and are negative.  Physical Exam Updated Vital Signs BP 121/76 (BP Location: Left Arm)   Pulse 95   Temp 99 F (37.2 C) (Oral)   Resp 16   Ht 5\' 4"  (1.626 m)   Wt 90.7 kg   SpO2 100%   BMI 34.33 kg/m   Physical Exam Vitals signs and nursing note reviewed.  Constitutional:      General: She is not in acute distress.    Appearance: She is well-developed. She is not diaphoretic.  HENT:     Head: Normocephalic and atraumatic.     Mouth/Throat:     Mouth: Mucous membranes are moist.     Pharynx: Oropharynx is clear.  Eyes:     Conjunctiva/sclera: Conjunctivae normal.  Neck:     Musculoskeletal: Neck supple.  Cardiovascular:     Rate and Rhythm: Normal rate and regular rhythm.     Pulses: Normal pulses.          Dorsalis pedis pulses are 2+ on the right side.     Heart sounds: Normal  heart sounds.  Pulmonary:     Effort: Pulmonary effort is normal. No respiratory distress.     Breath sounds: Normal breath sounds.  Abdominal:     Palpations: Abdomen is soft.     Tenderness: There is no abdominal tenderness. There is no guarding.  Musculoskeletal:     Right ankle: She exhibits swelling and deformity. Tenderness.     Right lower leg: No edema.  Lymphadenopathy:     Cervical: No cervical adenopathy.  Skin:    General: Skin is warm and dry.     Capillary Refill: Capillary refill takes less than 2 seconds.  Neurological:     Mental Status: She is alert and oriented to person, place, and time.     Comments: Sensation grossly intact to light touch in the extremities. Unable to complete full strength testing in RLE due to fracture, but strength 5/5 in all other extremities. Coordination intact. Cranial nerves III-XII grossly intact. No facial droop.   Psychiatric:        Mood and Affect: Mood and affect normal.        Speech: Speech normal.        Behavior: Behavior normal.      ED Treatments / Results  Labs (all labs ordered are listed, but only abnormal results are displayed) Labs Reviewed - No data to display  EKG None  Radiology Dg Ankle 2 Views Right  Result Date: 01/06/2019 CLINICAL DATA:  Closed reduction with casting of the trimalleolar ankle fracture. EXAM: RIGHT ANKLE - 2 VIEW 9:52 p.m.: COMPARISON:  RIGHT ankle x-rays earlier same day at 7:09 p.m. FINDINGS: Examination is performed in fiberglass cast material. Slight POSTERIOR subluxation of the talus relative to the tibial plafond. Trimalleolar fracture again demonstrated. IMPRESSION: Slight POSTERIOR subluxation of the talus relative to the tibial plafond in this patient with a trimalleolar fracture. Electronically Signed   By: Hulan Saas M.D.   On: 01/06/2019 22:45   Dg Ankle Complete Right  Result Date: 01/06/2019 CLINICAL DATA:  Ankle injury with pain. EXAM: RIGHT ANKLE - COMPLETE 3+ VIEW  COMPARISON:  None. FINDINGS: Three views study shows medial malleolar fracture with lateral subluxation of the talus and widening of the lateral ankle mortise. There is a comminuted fracture of the distal fibula at and just proximal to the level of the tibial plafond. Posterior lip fracture noted distal tibia as well. IMPRESSION: Trimalleolar fracture subluxation at the ankle. Electronically Signed  By: Kennith CenterEric  Mansell M.D.   On: 01/06/2019 19:46    Procedures .Sedation Date/Time: 01/06/2019 9:55 PM Performed by: Anselm PancoastJoy, Briteny Fulghum C, PA-C Authorized by: Anselm PancoastJoy, Calder Oblinger C, PA-C   Consent:    Consent obtained:  Verbal and written   Consent given by:  Patient   Risks discussed:  Dysrhythmia, inadequate sedation, nausea, vomiting, respiratory compromise necessitating ventilatory assistance and intubation and allergic reaction   Alternatives discussed:  Analgesia without sedation and anxiolysis Universal protocol:    Procedure explained and questions answered to patient or proxy's satisfaction: yes     Relevant documents present and verified: yes     Imaging studies available: yes     Immediately prior to procedure a time out was called: yes     Patient identity confirmation method:  Provided demographic data and verbally with patient Indications:    Procedure performed:  Fracture reduction   Procedure necessitating sedation performed by:  Different physician Pre-sedation assessment:    Time since last food or drink:  1600   ASA classification: class 1 - normal, healthy patient     Neck mobility: normal     Mallampati score:  III - soft palate, base of uvula visible   Pre-sedation assessments completed and reviewed: airway patency, cardiovascular function, mental status, nausea/vomiting, pain level, respiratory function and temperature   Immediate pre-procedure details:    Reassessment: Patient reassessed immediately prior to procedure     Reviewed: vital signs and NPO status     Verified: bag valve  mask available, emergency equipment available, intubation equipment available, IV patency confirmed and oxygen available   Procedure details (see MAR for exact dosages):    Preoxygenation:  Nasal cannula   Sedation:  Propofol   Analgesia:  Fentanyl   Intra-procedure monitoring:  Blood pressure monitoring, cardiac monitor, continuous capnometry, continuous pulse oximetry, frequent LOC assessments and frequent vital sign checks   Intra-procedure events: none     Total Provider sedation time (minutes):  10 Post-procedure details:    Post-sedation assessment completed:  01/06/2019 10:10 PM   Attendance: Constant attendance by certified staff until patient recovered     Recovery: Patient returned to pre-procedure baseline     Post-sedation assessments completed and reviewed: airway patency, cardiovascular function, hydration status, mental status, nausea/vomiting, pain level, respiratory function and temperature     Patient is stable for discharge or admission: yes     Patient tolerance:  Tolerated well, no immediate complications Comments:     Attending physician, Dr. Adriana Simasook, present during procedure. Reduction of fracture Date/Time: 01/06/2019 10:00 PM Performed by: Anselm PancoastJoy, Cong Hightower C, PA-C Authorized by: Anselm PancoastJoy, Anniah Glick C, PA-C  Consent: Verbal consent obtained. Written consent obtained. Risks and benefits: risks, benefits and alternatives were discussed Consent given by: patient Patient understanding: patient states understanding of the procedure being performed Patient consent: the patient's understanding of the procedure matches consent given Procedure consent: procedure consent matches procedure scheduled Patient identity confirmed: verbally with patient and provided demographic data Local anesthesia used: no  Anesthesia: Local anesthesia used: no  Sedation: Patient sedated: yes Sedation type: moderate (conscious) sedation Sedatives: propofol Analgesia: fentanyl Sedation start date/time:  01/06/2019 9:55 PM Sedation end date/time: 01/06/2019 10:06 PM Vitals: Vital signs were monitored during sedation.  Patient tolerance: Patient tolerated the procedure well with no immediate complications Comments: Reduction of right ankle fracture  .Splint Application Date/Time: 01/06/2019 10:03 PM Performed by: Anselm PancoastJoy, Antero Derosia C, PA-C Authorized by: Anselm PancoastJoy, Tessia Kassin C, PA-C   Consent:    Consent obtained:  Verbal and written   Consent given by:  Patient   Risks discussed:  Discoloration, numbness, pain and swelling Pre-procedure details:    Sensation:  Normal   Skin color:  Normal Procedure details:    Laterality:  Right   Location:  Ankle   Ankle:  R ankle   Splint type:  Short leg (Stirrup and posterior)   Supplies:  Elastic bandage, Ortho-Glass and cotton padding Post-procedure details:    Pain:  Improved   Sensation:  Normal   Skin color:  Normal   Patient tolerance of procedure:  Tolerated well, no immediate complications Comments:     Procedure was performed by the Ortho Tech with my evaluation before and after. I was available for consultation throughout the procedure.   (including critical care time)  Medications Ordered in ED Medications  morphine 4 MG/ML injection 4 mg (4 mg Intravenous Given 01/06/19 2137)  ondansetron (ZOFRAN) injection 4 mg (4 mg Intravenous Given 01/06/19 2137)  propofol (DIPRIVAN) 10 mg/mL bolus/IV push 45.4 mg (1 mg Intravenous Given 01/06/19 2155)  fentaNYL (SUBLIMAZE) 100 MCG/2ML injection (50 mcg  Given 01/06/19 2208)  0.9 %  sodium chloride infusion ( Intravenous Stopped 01/06/19 2327)  propofol (DIPRIVAN) 10 mg/mL bolus/IV push (25 mg Intravenous Given 01/06/19 2159)  fentaNYL (SUBLIMAZE) injection (50 mcg Intravenous Given 01/06/19 2002)     Initial Impression / Assessment and Plan / ED Course  I have reviewed the triage vital signs and the nursing notes.  Pertinent labs & imaging results that were available during my care of the patient were  reviewed by me and considered in my medical decision making (see chart for details).  Clinical Course as of Jan 07 3  Wynelle Link Jan 06, 2019  2053 Spoke with Dr. Eulah Pont, orthopedic surgeon.  Advises for [place] to splint the ankle, reduce it, and obtain repeat xray. Patient will be seen in the office.   [SJ]    Clinical Course User Index [SJ] Jacklynn Dehaas C, PA-C   Patient presents with right ankle injury.  Trimalleolar fracture noted on x-ray.  Patient sedated, fracture reduced, and patient splinted.  She will be nonweightbearing.  Orthopedic follow-up. The patient was given instructions for home care as well as return precautions. Patient voices understanding of these instructions, accepts the plan, and is comfortable with discharge.   Final Clinical Impressions(s) / ED Diagnoses   Final diagnoses:  Closed trimalleolar fracture of right ankle, initial encounter    ED Discharge Orders         Ordered    HYDROcodone-acetaminophen (NORCO/VICODIN) 5-325 MG tablet  Every 6 hours PRN     01/06/19 2252    ibuprofen (ADVIL,MOTRIN) 600 MG tablet  Every 6 hours PRN     01/06/19 2252           Anselm Pancoast, PA-C 01/07/19 0008    Donnetta Hutching, MD 01/09/19 1348

## 2019-01-06 NOTE — ED Notes (Signed)
Bed: RESA Expected date:  Expected time:  Means of arrival:  Comments: Greer PickerelHall D

## 2019-01-08 ENCOUNTER — Encounter (HOSPITAL_BASED_OUTPATIENT_CLINIC_OR_DEPARTMENT_OTHER): Payer: Self-pay | Admitting: *Deleted

## 2019-01-08 ENCOUNTER — Other Ambulatory Visit: Payer: Self-pay

## 2019-01-10 NOTE — H&P (Signed)
[facility]  [address]   [address] Emma Cantu [address] [phone]   A Division of [facility]  RE: Emma Cantu, Emma Cantu   [id number]      DOB: [birth year]  INITIAL EVALUATION:  01-07-19  REASON FOR VISIT: Right ankle injury suffered on 01-06-18.  HPI:  She is 23 years old and she was going down a driveway and slipped. She felt a pop in her ankle. She was seen in the emergency room and referred to me. She has been splinted. Her pain has been controlled.    EXAMINATION: Well appearing female in no apparent distress. Painless passive motion of her toes. Neurovascularly intact to her toes. Splint is benign.   IMAGES: X-rays reviewed by me:   I reviewed her X-rays which demonstrate a displaced trimalleolar ankle fracture.   ASSESSMENT/PLAN: Trimalleolar ankle fracture.  She will elevate this full time. We will plan on an open reduction and internal fixation of this on Friday. I will assess the need for a fixation of the posterior malleolus  intraoperatively. Non-weightbearing until then.    Emma Cantu.  Emma Cantu, M.D.  Electronically verified by Emma Cantu. Emma Cantu, M.D. TDMNorman Cantu D:   01-08-19 T:   01-10-19 cc:  [facility] fax [phone]

## 2019-01-11 ENCOUNTER — Ambulatory Visit (HOSPITAL_BASED_OUTPATIENT_CLINIC_OR_DEPARTMENT_OTHER)
Admission: RE | Admit: 2019-01-11 | Discharge: 2019-01-11 | Disposition: A | Discharge status: Home / Self Care | Payer: BLUE CROSS/BLUE SHIELD | Source: Ambulatory Visit | Attending: Orthopedic Surgery | Admitting: Orthopedic Surgery

## 2019-01-11 ENCOUNTER — Ambulatory Visit (HOSPITAL_BASED_OUTPATIENT_CLINIC_OR_DEPARTMENT_OTHER): Payer: BLUE CROSS/BLUE SHIELD | Admitting: Certified Registered"

## 2019-01-11 ENCOUNTER — Encounter (HOSPITAL_BASED_OUTPATIENT_CLINIC_OR_DEPARTMENT_OTHER)
Admission: RE | Disposition: A | Discharge status: Home / Self Care | Payer: Self-pay | Source: Ambulatory Visit | Attending: Orthopedic Surgery

## 2019-01-11 ENCOUNTER — Other Ambulatory Visit: Payer: Self-pay

## 2019-01-11 ENCOUNTER — Encounter (HOSPITAL_BASED_OUTPATIENT_CLINIC_OR_DEPARTMENT_OTHER): Payer: Self-pay | Admitting: Anesthesiology

## 2019-01-11 DIAGNOSIS — W1849XA Other slipping, tripping and stumbling without falling, initial encounter: Secondary | ICD-10-CM | POA: Insufficient documentation

## 2019-01-11 DIAGNOSIS — S82851A Displaced trimalleolar fracture of right lower leg, initial encounter for closed fracture: Secondary | ICD-10-CM | POA: Diagnosis not present

## 2019-01-11 DIAGNOSIS — S82891A Other fracture of right lower leg, initial encounter for closed fracture: Secondary | ICD-10-CM

## 2019-01-11 DIAGNOSIS — G8918 Other acute postprocedural pain: Secondary | ICD-10-CM | POA: Diagnosis not present

## 2019-01-11 HISTORY — PX: ORIF ANKLE FRACTURE: SHX5408

## 2019-01-11 SURGERY — OPEN REDUCTION INTERNAL FIXATION (ORIF) ANKLE FRACTURE
Anesthesia: General | Site: Ankle | Laterality: Right

## 2019-01-11 MED ORDER — MIDAZOLAM HCL 2 MG/2ML IJ SOLN
1.0000 mg | INTRAMUSCULAR | Status: DC | PRN
  Administered 2019-01-11: 2 mg via INTRAVENOUS

## 2019-01-11 MED ORDER — FENTANYL CITRATE (PF) 100 MCG/2ML IJ SOLN
INTRAMUSCULAR | Status: DC | PRN
  Administered 2019-01-11 (×2): 50 ug via INTRAVENOUS

## 2019-01-11 MED ORDER — PROPOFOL 10 MG/ML IV BOLUS
INTRAVENOUS | Status: DC | PRN
  Administered 2019-01-11: 200 mg via INTRAVENOUS

## 2019-01-11 MED ORDER — BUPIVACAINE-EPINEPHRINE (PF) 0.25% -1:200000 IJ SOLN
INTRAMUSCULAR | Status: AC
  Filled 2019-01-11: qty 30

## 2019-01-11 MED ORDER — ACETAMINOPHEN 500 MG PO TABS: 1000.0000 mg | ORAL_TABLET | Freq: Three times a day (TID) | ORAL | 0 refills | Status: AC

## 2019-01-11 MED ORDER — BUPIVACAINE HCL (PF) 0.5 % IJ SOLN
INTRAMUSCULAR | Status: AC
  Filled 2019-01-11: qty 30

## 2019-01-11 MED ORDER — ACETAMINOPHEN 500 MG PO TABS
1000.0000 mg | ORAL_TABLET | Freq: Once | ORAL | Status: AC
  Administered 2019-01-11: 1000 mg via ORAL

## 2019-01-11 MED ORDER — PROMETHAZINE HCL 25 MG/ML IJ SOLN
6.2500 mg | INTRAMUSCULAR | Status: DC | PRN
  Administered 2019-01-11: 6.25 mg via INTRAVENOUS

## 2019-01-11 MED ORDER — BUPIVACAINE HCL (PF) 0.25 % IJ SOLN
INTRAMUSCULAR | Status: DC | PRN
  Administered 2019-01-11: 15 mL

## 2019-01-11 MED ORDER — SCOPOLAMINE 1 MG/3DAYS TD PT72: 1.0000 | MEDICATED_PATCH | Freq: Once | TRANSDERMAL | Status: DC | PRN

## 2019-01-11 MED ORDER — METHOCARBAMOL 500 MG PO TABS: 500.0000 mg | ORAL_TABLET | Freq: Three times a day (TID) | ORAL | 0 refills | Status: DC | PRN

## 2019-01-11 MED ORDER — CELECOXIB 200 MG PO CAPS: 200.0000 mg | ORAL_CAPSULE | Freq: Two times a day (BID) | ORAL | 0 refills | Status: AC

## 2019-01-11 MED ORDER — ONDANSETRON HCL 4 MG/2ML IJ SOLN
INTRAMUSCULAR | Status: DC | PRN
  Administered 2019-01-11: 4 mg via INTRAVENOUS

## 2019-01-11 MED ORDER — ASPIRIN EC 81 MG PO TBEC: 81.0000 mg | DELAYED_RELEASE_TABLET | Freq: Two times a day (BID) | ORAL | 0 refills | Status: DC

## 2019-01-11 MED ORDER — GABAPENTIN 300 MG PO CAPS
300.0000 mg | ORAL_CAPSULE | Freq: Once | ORAL | Status: AC
  Administered 2019-01-11: 300 mg via ORAL

## 2019-01-11 MED ORDER — OXYCODONE HCL 5 MG PO TABS: 5.0000 mg | ORAL_TABLET | ORAL | 0 refills | Status: DC | PRN

## 2019-01-11 MED ORDER — BUPIVACAINE HCL (PF) 0.25 % IJ SOLN
INTRAMUSCULAR | Status: AC
  Filled 2019-01-11: qty 30

## 2019-01-11 MED ORDER — ACETAMINOPHEN 500 MG PO TABS
ORAL_TABLET | ORAL | Status: AC
  Filled 2019-01-11: qty 2

## 2019-01-11 MED ORDER — SODIUM CHLORIDE (PF) 0.9 % IJ SOLN
INTRAMUSCULAR | Status: AC
  Filled 2019-01-11: qty 10

## 2019-01-11 MED ORDER — CEFAZOLIN SODIUM-DEXTROSE 2-4 GM/100ML-% IV SOLN
2.0000 g | INTRAVENOUS | Status: AC
  Administered 2019-01-11: 2 g via INTRAVENOUS

## 2019-01-11 MED ORDER — MEPERIDINE HCL 25 MG/ML IJ SOLN: 6.2500 mg | INTRAMUSCULAR | Status: DC | PRN

## 2019-01-11 MED ORDER — LACTATED RINGERS IV SOLN
INTRAVENOUS | Status: DC
  Administered 2019-01-11: 15:00:00 via INTRAVENOUS

## 2019-01-11 MED ORDER — BUPIVACAINE-EPINEPHRINE (PF) 0.5% -1:200000 IJ SOLN
INTRAMUSCULAR | Status: DC | PRN
  Administered 2019-01-11: 30 mL via PERINEURAL

## 2019-01-11 MED ORDER — LACTATED RINGERS IV SOLN: INTRAVENOUS | Status: DC

## 2019-01-11 MED ORDER — LIDOCAINE HCL (CARDIAC) PF 100 MG/5ML IV SOSY
PREFILLED_SYRINGE | INTRAVENOUS | Status: DC | PRN
  Administered 2019-01-11: 30 mg via INTRAVENOUS

## 2019-01-11 MED ORDER — GABAPENTIN 300 MG PO CAPS
ORAL_CAPSULE | ORAL | Status: AC
  Filled 2019-01-11: qty 1

## 2019-01-11 MED ORDER — DEXAMETHASONE SODIUM PHOSPHATE 10 MG/ML IJ SOLN
INTRAMUSCULAR | Status: DC | PRN
  Administered 2019-01-11: 10 mg via INTRAVENOUS

## 2019-01-11 MED ORDER — FENTANYL CITRATE (PF) 100 MCG/2ML IJ SOLN
INTRAMUSCULAR | Status: AC
  Filled 2019-01-11: qty 2

## 2019-01-11 MED ORDER — LACTATED RINGERS IV SOLN
INTRAVENOUS | Status: DC
  Administered 2019-01-11 (×2): via INTRAVENOUS

## 2019-01-11 MED ORDER — OXYCODONE HCL 5 MG/5ML PO SOLN: 5.0000 mg | Freq: Once | ORAL | Status: DC | PRN

## 2019-01-11 MED ORDER — FENTANYL CITRATE (PF) 100 MCG/2ML IJ SOLN
50.0000 ug | INTRAMUSCULAR | Status: DC | PRN
  Administered 2019-01-11: 50 ug via INTRAVENOUS

## 2019-01-11 MED ORDER — ONDANSETRON HCL 4 MG PO TABS: 4.0000 mg | ORAL_TABLET | Freq: Three times a day (TID) | ORAL | 0 refills | Status: DC | PRN

## 2019-01-11 MED ORDER — CHLORHEXIDINE GLUCONATE 4 % EX LIQD: 60.0000 mL | Freq: Once | CUTANEOUS | Status: DC

## 2019-01-11 MED ORDER — MIDAZOLAM HCL 2 MG/2ML IJ SOLN
INTRAMUSCULAR | Status: AC
  Filled 2019-01-11: qty 2

## 2019-01-11 MED ORDER — GABAPENTIN 300 MG PO CAPS: 300.0000 mg | ORAL_CAPSULE | Freq: Two times a day (BID) | ORAL | 0 refills | Status: DC

## 2019-01-11 MED ORDER — HYDROMORPHONE HCL 1 MG/ML IJ SOLN: 0.2500 mg | INTRAMUSCULAR | Status: DC | PRN

## 2019-01-11 MED ORDER — PROMETHAZINE HCL 25 MG/ML IJ SOLN
INTRAMUSCULAR | Status: AC
  Filled 2019-01-11: qty 1

## 2019-01-11 MED ORDER — CEFAZOLIN SODIUM-DEXTROSE 2-4 GM/100ML-% IV SOLN
INTRAVENOUS | Status: AC
  Filled 2019-01-11: qty 100

## 2019-01-11 MED ORDER — DOCUSATE SODIUM 100 MG PO CAPS: 100.0000 mg | ORAL_CAPSULE | Freq: Two times a day (BID) | ORAL | 0 refills | Status: DC

## 2019-01-11 MED ORDER — OXYCODONE HCL 5 MG PO TABS: 5.0000 mg | ORAL_TABLET | Freq: Once | ORAL | Status: DC | PRN

## 2019-01-11 SURGICAL SUPPLY — 84 items
1.4x150mm smooth wire ×4 IMPLANT
BANDAGE ACE 4X5 VEL STRL LF (GAUZE/BANDAGES/DRESSINGS) ×3 IMPLANT
BANDAGE ACE 6X5 VEL STRL LF (GAUZE/BANDAGES/DRESSINGS) ×3 IMPLANT
BANDAGE ESMARK 6X9 LF (GAUZE/BANDAGES/DRESSINGS) ×1 IMPLANT
BIT DRILL 2.5X125 (BIT) ×2 IMPLANT
BIT DRILL 3.5X125 (BIT) IMPLANT
BIT DRILL CANN 2.7 (BIT) ×1
BIT DRILL CANN 2.7MM (BIT) ×1
BIT DRILL SRG 2.7XCANN AO CPLG (BIT) IMPLANT
BIT DRL SRG 2.7XCANN AO CPLNG (BIT) ×1
BLADE SURG 15 STRL LF DISP TIS (BLADE) ×2 IMPLANT
BLADE SURG 15 STRL SS (BLADE) ×6
BNDG COHESIVE 4X5 TAN STRL (GAUZE/BANDAGES/DRESSINGS) ×3 IMPLANT
BNDG ESMARK 6X9 LF (GAUZE/BANDAGES/DRESSINGS) ×3
CHLORAPREP W/TINT 26ML (MISCELLANEOUS) ×3 IMPLANT
CLOSURE STERI-STRIP 1/2X4 (GAUZE/BANDAGES/DRESSINGS) ×1
CLSR STERI-STRIP ANTIMIC 1/2X4 (GAUZE/BANDAGES/DRESSINGS) ×2 IMPLANT
COVER BACK TABLE 60X90IN (DRAPES) ×3 IMPLANT
COVER WAND RF STERILE (DRAPES) IMPLANT
CUFF TOURNIQUET SINGLE 24IN (TOURNIQUET CUFF) IMPLANT
CUFF TOURNIQUET SINGLE 34IN LL (TOURNIQUET CUFF) IMPLANT
CUFF TOURNIQUET SINGLE 44IN (TOURNIQUET CUFF) ×2 IMPLANT
DECANTER SPIKE VIAL GLASS SM (MISCELLANEOUS) IMPLANT
DRAPE EXTREMITY T 121X128X90 (DISPOSABLE) ×3 IMPLANT
DRAPE IMP U-DRAPE 54X76 (DRAPES) ×3 IMPLANT
DRAPE OEC MINIVIEW 54X84 (DRAPES) ×3 IMPLANT
DRAPE U-SHAPE 47X51 STRL (DRAPES) ×3 IMPLANT
DRILL BIT 3.5X125 (BIT) ×2
DRSG EMULSION OIL 3X3 NADH (GAUZE/BANDAGES/DRESSINGS) ×3 IMPLANT
DRSG PAD ABDOMINAL 8X10 ST (GAUZE/BANDAGES/DRESSINGS) ×5 IMPLANT
ELECT REM PT RETURN 9FT ADLT (ELECTROSURGICAL) ×3
ELECTRODE REM PT RTRN 9FT ADLT (ELECTROSURGICAL) ×1 IMPLANT
GAUZE SPONGE 4X4 12PLY STRL (GAUZE/BANDAGES/DRESSINGS) ×3 IMPLANT
GLOVE BIO SURGEON STRL SZ 6.5 (GLOVE) ×1 IMPLANT
GLOVE BIO SURGEON STRL SZ7 (GLOVE) ×2 IMPLANT
GLOVE BIO SURGEON STRL SZ7.5 (GLOVE) ×6 IMPLANT
GLOVE BIO SURGEONS STRL SZ 6.5 (GLOVE) ×1
GLOVE BIOGEL PI IND STRL 7.0 (GLOVE) IMPLANT
GLOVE BIOGEL PI IND STRL 7.5 (GLOVE) IMPLANT
GLOVE BIOGEL PI IND STRL 8 (GLOVE) ×2 IMPLANT
GLOVE BIOGEL PI INDICATOR 7.0 (GLOVE) ×2
GLOVE BIOGEL PI INDICATOR 7.5 (GLOVE) ×4
GLOVE BIOGEL PI INDICATOR 8 (GLOVE) ×4
GLOVE SURG SS PI 7.0 STRL IVOR (GLOVE) ×2 IMPLANT
GOWN STRL REUS W/ TWL LRG LVL3 (GOWN DISPOSABLE) ×2 IMPLANT
GOWN STRL REUS W/ TWL XL LVL3 (GOWN DISPOSABLE) ×1 IMPLANT
GOWN STRL REUS W/TWL LRG LVL3 (GOWN DISPOSABLE) ×4
GOWN STRL REUS W/TWL XL LVL3 (GOWN DISPOSABLE) ×4
NEEDLE HYPO 22GX1.5 SAFETY (NEEDLE) ×2 IMPLANT
NS IRRIG 1000ML POUR BTL (IV SOLUTION) ×3 IMPLANT
PACK BASIN DAY SURGERY FS (CUSTOM PROCEDURE TRAY) ×3 IMPLANT
PAD CAST 4YDX4 CTTN HI CHSV (CAST SUPPLIES) ×1 IMPLANT
PADDING CAST ABS 4INX4YD NS (CAST SUPPLIES) ×4
PADDING CAST ABS COTTON 4X4 ST (CAST SUPPLIES) ×2 IMPLANT
PADDING CAST COTTON 4X4 STRL (CAST SUPPLIES) ×2
PADDING CAST COTTON 6X4 STRL (CAST SUPPLIES) ×3 IMPLANT
PENCIL BUTTON HOLSTER BLD 10FT (ELECTRODE) ×3 IMPLANT
PLATE 1/3 TUBULAR 7H (Plate) ×2 IMPLANT
SCREW CANCELLOUS 4.0X14 (Screw) ×4 IMPLANT
SCREW CANN ASNIS III 4.0X40MM (Screw) ×2 IMPLANT
SCREW CORTEX ST MATTA 3.5X12MM (Screw) ×4 IMPLANT
SCREW CORTEX ST MATTA 3.5X14 (Screw) ×2 IMPLANT
SCREW CORTEX ST MATTA 3.5X18MM (Screw) ×2 IMPLANT
SHEET MEDIUM DRAPE 40X70 STRL (DRAPES) ×2 IMPLANT
SLEEVE SCD COMPRESS KNEE MED (MISCELLANEOUS) IMPLANT
SPLINT FAST PLASTER 5X30 (CAST SUPPLIES) ×40
SPLINT PLASTER CAST FAST 5X30 (CAST SUPPLIES) ×20 IMPLANT
SPONGE LAP 4X18 RFD (DISPOSABLE) ×3 IMPLANT
SUCTION FRAZIER HANDLE 10FR (MISCELLANEOUS) ×2
SUCTION TUBE FRAZIER 10FR DISP (MISCELLANEOUS) ×1 IMPLANT
SUT ETHILON 3 0 PS 1 (SUTURE) ×6 IMPLANT
SUT MNCRL AB 4-0 PS2 18 (SUTURE) IMPLANT
SUT MON AB 2-0 CT1 36 (SUTURE) IMPLANT
SUT MON AB 3-0 SH 27 (SUTURE)
SUT MON AB 3-0 SH27 (SUTURE) IMPLANT
SUT VIC AB 0 SH 27 (SUTURE) ×3 IMPLANT
SUT VIC AB 2-0 SH 27 (SUTURE) ×4
SUT VIC AB 2-0 SH 27XBRD (SUTURE) IMPLANT
SYR BULB 3OZ (MISCELLANEOUS) ×3 IMPLANT
SYR CONTROL 10ML LL (SYRINGE) ×2 IMPLANT
TOWEL GREEN STERILE FF (TOWEL DISPOSABLE) ×6 IMPLANT
TUBE CONNECTING 20'X1/4 (TUBING) ×1
TUBE CONNECTING 20X1/4 (TUBING) ×2 IMPLANT
UNDERPAD 30X30 (UNDERPADS AND DIAPERS) ×3 IMPLANT

## 2019-01-11 NOTE — Anesthesia Preprocedure Evaluation (Addendum)
Anesthesia Evaluation  Patient identified by MRN, date of birth, ID band Patient awake    Reviewed: Allergy & Precautions, NPO status , Patient's Chart, lab work & pertinent test results  Airway Mallampati: III  TM Distance: >3 FB Neck ROM: Full    Dental  (+) Teeth Intact, Dental Advisory Given   Pulmonary neg pulmonary ROS,    breath sounds clear to auscultation       Cardiovascular negative cardio ROS   Rhythm:Regular Rate:Normal     Neuro/Psych negative neurological ROS     GI/Hepatic negative GI ROS, Neg liver ROS,   Endo/Other  negative endocrine ROS  Renal/GU negative Renal ROS     Musculoskeletal negative musculoskeletal ROS (+)   Abdominal (+) + obese,   Peds  Hematology negative hematology ROS (+)   Anesthesia Other Findings   Reproductive/Obstetrics                            Anesthesia Physical Anesthesia Plan  ASA: III  Anesthesia Plan: General   Post-op Pain Management: GA combined w/ Regional for post-op pain   Induction: Intravenous  PONV Risk Score and Plan: 4 or greater and Ondansetron, Dexamethasone, Midazolam and Scopolamine patch - Pre-op  Airway Management Planned: LMA  Additional Equipment: None  Intra-op Plan:   Post-operative Plan: Extubation in OR  Informed Consent: I have reviewed the patients History and Physical, chart, labs and discussed the procedure including the risks, benefits and alternatives for the proposed anesthesia with the patient or authorized representative who has indicated his/her understanding and acceptance.     Dental advisory given  Plan Discussed with: CRNA  Anesthesia Plan Comments: (Did not perform adductor canal block due to patient inability to be properly positioned and indistinguishable anatomy. Will attempt in PACU if needed. )      Anesthesia Quick Evaluation

## 2019-01-11 NOTE — Transfer of Care (Signed)
Immediate Anesthesia Transfer of Care Note  Patient: Emma Cantu  Procedure(s) Performed: OPEN REDUCTION INTERNAL FIXATION (ORIF) ANKLE FRACTURE (Right Ankle)  Patient Location: PACU  Anesthesia Type:GA combined with regional for post-op pain  Level of Consciousness: sedated and patient cooperative  Airway & Oxygen Therapy: Patient Spontanous Breathing and Patient connected to face mask oxygen  Post-op Assessment: Report given to RN and Post -op Vital signs reviewed and stable  Post vital signs: Reviewed and stable  Last Vitals:  Vitals Value Taken Time  BP    Temp    Pulse 92 01/11/2019  1:45 PM  Resp    SpO2 93 % 01/11/2019  1:45 PM  Vitals shown include unvalidated device data.  Last Pain:  Vitals:   01/11/19 1120  TempSrc: Oral  PainSc: 5          Complications: No apparent anesthesia complications

## 2019-01-11 NOTE — Anesthesia Postprocedure Evaluation (Signed)
Anesthesia Post Note  Patient: Emma Cantu  Procedure(s) Performed: OPEN REDUCTION INTERNAL FIXATION (ORIF) ANKLE FRACTURE (Right Ankle)     Patient location during evaluation: PACU Anesthesia Type: General Level of consciousness: awake and alert Pain management: pain level controlled Vital Signs Assessment: post-procedure vital signs reviewed and stable Respiratory status: spontaneous breathing, nonlabored ventilation, respiratory function stable and patient connected to nasal cannula oxygen Cardiovascular status: blood pressure returned to baseline and stable Postop Assessment: no apparent nausea or vomiting Anesthetic complications: no    Last Vitals:  Vitals:   01/11/19 1500 01/11/19 1515  BP: 138/90 (!) 134/92  Pulse: 83 82  Resp: (!) 22 19  Temp:    SpO2: 96% 98%                 Shelton SilvasKevin D Quintessa Simmerman

## 2019-01-11 NOTE — Progress Notes (Signed)
Assisted Dr. Hollis with right, ultrasound guided, popliteal block. Side rails up, monitors on throughout procedure. See vital signs in flow sheet. Tolerated Procedure well. 

## 2019-01-11 NOTE — Op Note (Signed)
01/11/2019  1:12 PM  PATIENT:  Emma Cantu    PRE-OPERATIVE DIAGNOSIS:  DISPLACED TRIMALLEOLAR LOWERMLEG  POST-OPERATIVE DIAGNOSIS:  Same  PROCEDURE:  OPEN REDUCTION INTERNAL FIXATION (ORIF) ANKLE FRACTURE  SURGEON:  Sheral Apley, MD  ASSISTANT: Aquilla Hacker, PA-C, he was present and scrubbed throughout the case, critical for completion in a timely fashion, and for retraction, instrumentation, and closure.   ANESTHESIA:   gen   PREOPERATIVE INDICATIONS:  [name] is a  23 y.o. female with a diagnosis of DISPLACED TRIMALLEOLAR LOWERMLEG who failed conservative measures and elected for surgical management.    The risks benefits and alternatives were discussed with the patient preoperatively including but not limited to the risks of infection, bleeding, nerve injury, cardiopulmonary complications, the need for revision surgery, among others, and the patient was willing to proceed.  OPERATIVE IMPLANTS: stryker lat plate and cannulated screw  OPERATIVE FINDINGS: Unstable ankle fracture. Stable syndesmosis post op  BLOOD LOSS: min  COMPLICATIONS: none  TOURNIQUET TIME:  OPERATIVE PROCEDURE:  Patient was identified in the preoperative holding area and site was marked by me He was transported to the operating theater and placed on the table in supine position taking care to pad all bony prominences. After a preincinduction time out anesthesia was induced. The right lower extremity was prepped and draped in normal sterile fashion and a pre-incision timeout was performed. Virgin A Perdomo received ancef for preoperative antibiotics.   I made a lateral incision of roughly 7 cm dissection was carried down sharply to the distal fibula and then spreading dissection was used proximally to protect the superficial peroneal nerve. I sharply incised the periosteum and took care to protect the peroneal tendons. I then debrided the fracture site and performed a reduction maneuver  which was held in place with a clamp.   I placed a lag screw across the fracture  I then selected a 7-hole one third tubular plate and placed in a neutralization fashion care was taken distally so as not to penetrate the joint with the cancellus screws.  I then turned my attention medially where I created a 4 cm incision and dissected sharply down to the medial Mal fracture taking care to protect the saphenous vein. I debrided the fracture and reduced and held in place with a tenaculum. I then drilled and placed a partially threaded 40 mm cannulated screw across the fracture.  I then stressed the syndesmosis and it was stable  I assesed the posterior mal piece and it was small enough to not require fixation as it involved less than 20% of the articular surface  The wound was then thoroughly irrigated and closed using a 0 Vicryl and absorbable Monocryl sutures. He was placed in a short leg splint.   POST OPERATIVE PLAN: Non-weightbearing. DVT prophylaxis will consist of mobilization and chemical px

## 2019-01-11 NOTE — Anesthesia Procedure Notes (Signed)
Anesthesia Regional Block: Popliteal block   Pre-Anesthetic Checklist: ,, timeout performed, Correct Patient, Correct Site, Correct Laterality, Correct Procedure, Correct Position, site marked, Risks and benefits discussed,  Surgical consent,  Pre-op evaluation,  At surgeon's request and post-op pain management  Laterality: Right  Prep: chloraprep       Needles:  Injection technique: Single-shot  Needle Type: Echogenic Stimulator Needle     Needle Length: 9cm  Needle Gauge: 21     Additional Needles:   Procedures:,,,, ultrasound used (permanent image in chart),,,,  Narrative:  Start time: 01/11/2019 11:55 AM End time: 01/11/2019 12:00 PM Injection made incrementally with aspirations every 5 mL.  Performed by: Personally  Anesthesiologist: Shelton Silvas, MD  Additional Notes: Patient tolerated the procedure well. Local anesthetic introduced in an incremental fashion under minimal resistance after negative aspirations. No paresthesias were elicited. After completion of the procedure, no acute issues were identified and patient continued to be monitored by RN.

## 2019-01-11 NOTE — Anesthesia Procedure Notes (Signed)
Procedure Name: LMA Insertion Date/Time: 01/11/2019 12:19 PM Performed by: Sheryn Bison, CRNA Pre-anesthesia Checklist: Patient identified, Emergency Drugs available, Suction available and Patient being monitored Patient Re-evaluated:Patient Re-evaluated prior to induction Oxygen Delivery Method: Circle system utilized Preoxygenation: Pre-oxygenation with 100% oxygen Induction Type: IV induction Ventilation: Mask ventilation without difficulty LMA: LMA inserted LMA Size: 4.0 Number of attempts: 1 Airway Equipment and Method: Bite block Placement Confirmation: positive ETCO2 Tube secured with: Tape Dental Injury: Teeth and Oropharynx as per pre-operative assessment

## 2019-01-11 NOTE — Discharge Instructions (Signed)
NO TYLENOL BEFORE 5PM TODAY!       Elevate leg - Toes above nose at all times to reduce pain / swelling.  Pain: If needed, you may increase narcotic breakthrough pain medication (oxycodone) for the first few days post op to 2 tablets every 4 hours.  Stop as oxycodone as soon as you are able.  You may loosen and re-apply ace wrap if it feels too tight.  Weight Bearing:  Non weight bearing affected leg.  Diet: As you were doing prior to hospitalization   Shower:  You have a splint on, leave the splint in place and keep the splint dry with a plastic bag.  Dressing:  You have a splint. Leave the splint in place and we will change your bandages during your first follow-up appointment.    Activity:  Increase activity slowly as tolerated, but follow the weight bearing instructions below.  The rules on driving is that you can not be taking narcotics while you drive, and you must feel in control of the vehicle.    To prevent constipation:  Narcotic medicines cause constipation.  Wean these as soon as is appropriate.   You may use a stool softener such as -  Colace (over the counter) 100 mg by mouth twice a day  Drink plenty of fluids (prune juice may be helpful) and high fiber foods Miralax (over the counter) for constipation as needed.    Itching:  If you experience itching with your medications, try taking only a single pain pill, or even half a pain pill at a time.  You can also use benadryl over the counter for itching or also to help with sleep.   Precautions:  If you experience chest pain or shortness of breath - call 911 immediately for transfer to the hospital emergency department!!  If you develop a fever greater that 101 F, purulent drainage from wound, increased redness or drainage from wound, or calf pain -- Call the office at [phone]                                                 Follow- Up Appointment:  Please call for an appointment to be seen in 2 weeks [place] -  [phone]     Regional Anesthesia Blocks  1. Numbness or the inability to move the "blocked" extremity may last from 3-48 hours after placement. The length of time depends on the medication injected and your individual response to the medication. If the numbness is not going away after 48 hours, call your surgeon.  2. The extremity that is blocked will need to be protected until the numbness is gone and the  Strength has returned. Because you cannot feel it, you will need to take extra care to avoid injury. Because it may be weak, you may have difficulty moving it or using it. You may not know what position it is in without looking at it while the block is in effect.  3. For blocks in the legs and feet, returning to weight bearing and walking needs to be done carefully. You will need to wait until the numbness is entirely gone and the strength has returned. You should be able to move your leg and foot normally before you try and bear weight or walk. You will need someone to be with you when you first  try to ensure you do not fall and possibly risk injury.  4. Bruising and tenderness at the needle site are common side effects and will resolve in a few days.  5. Persistent numbness or new problems with movement should be communicated to the surgeon or the [facility] [phone] [facility] [phone]).          Post Anesthesia Home Care Instructions  Activity: Get plenty of rest for the remainder of the day. A responsible individual must stay with you for 24 hours following the procedure.  For the next 24 hours, DO NOT: -Drive a car -Advertising copywriter -Drink alcoholic beverages -Take any medication unless instructed by your physician -Make any legal decisions or sign important papers.  Meals: Start with liquid foods such as gelatin or soup. Progress to regular foods as tolerated. Avoid greasy, spicy, heavy foods. If nausea and/or vomiting  occur, drink only clear liquids until the nausea and/or vomiting subsides. Call your physician if vomiting continues.  Special Instructions/Symptoms: Your throat may feel dry or sore from the anesthesia or the breathing tube placed in your throat during surgery. If this causes discomfort, gargle with warm salt water. The discomfort should disappear within 24 hours.  If you had a scopolamine patch placed behind your ear for the management of post- operative nausea and/or vomiting:  1. The medication in the patch is effective for 72 hours, after which it should be removed.  Wrap patch in a tissue and discard in the trash. Wash hands thoroughly with soap and water. 2. You may remove the patch earlier than 72 hours if you experience unpleasant side effects which may include dry mouth, dizziness or visual disturbances. 3. Avoid touching the patch. Wash your hands with soap and water after contact with the patch.

## 2019-01-11 NOTE — Interval H&P Note (Signed)
I participated in the care of this patient and agree with the above history, physical and evaluation. I performed a review of the history and a physical exam as detailed   Jakiera Ehler Daniel Dolly Harbach MD  

## 2019-01-14 ENCOUNTER — Encounter (HOSPITAL_BASED_OUTPATIENT_CLINIC_OR_DEPARTMENT_OTHER): Payer: Self-pay | Admitting: Orthopedic Surgery

## 2019-01-28 ENCOUNTER — Encounter (HOSPITAL_COMMUNITY): Payer: Self-pay | Admitting: *Deleted

## 2019-01-28 ENCOUNTER — Emergency Department (HOSPITAL_COMMUNITY): Payer: BLUE CROSS/BLUE SHIELD

## 2019-01-28 ENCOUNTER — Other Ambulatory Visit: Payer: Self-pay

## 2019-01-28 ENCOUNTER — Inpatient Hospital Stay (HOSPITAL_COMMUNITY)
Admission: EM | Admit: 2019-01-28 | Discharge: 2019-01-30 | DRG: 176 | Disposition: A | Discharge status: Home Health | Payer: BLUE CROSS/BLUE SHIELD | Attending: Family Medicine | Admitting: Family Medicine

## 2019-01-28 DIAGNOSIS — R0602 Shortness of breath: Secondary | ICD-10-CM | POA: Diagnosis not present

## 2019-01-28 DIAGNOSIS — Z7982 Long term (current) use of aspirin: Secondary | ICD-10-CM | POA: Diagnosis not present

## 2019-01-28 DIAGNOSIS — E669 Obesity, unspecified: Secondary | ICD-10-CM | POA: Diagnosis not present

## 2019-01-28 DIAGNOSIS — Z79899 Other long term (current) drug therapy: Secondary | ICD-10-CM | POA: Diagnosis not present

## 2019-01-28 DIAGNOSIS — Z823 Family history of stroke: Secondary | ICD-10-CM | POA: Diagnosis not present

## 2019-01-28 DIAGNOSIS — L309 Dermatitis, unspecified: Secondary | ICD-10-CM | POA: Diagnosis not present

## 2019-01-28 DIAGNOSIS — Z86711 Personal history of pulmonary embolism: Secondary | ICD-10-CM

## 2019-01-28 DIAGNOSIS — Z23 Encounter for immunization: Secondary | ICD-10-CM | POA: Diagnosis not present

## 2019-01-28 DIAGNOSIS — Z6839 Body mass index (BMI) 39.0-39.9, adult: Secondary | ICD-10-CM | POA: Diagnosis not present

## 2019-01-28 DIAGNOSIS — Z8249 Family history of ischemic heart disease and other diseases of the circulatory system: Secondary | ICD-10-CM

## 2019-01-28 DIAGNOSIS — Z833 Family history of diabetes mellitus: Secondary | ICD-10-CM

## 2019-01-28 DIAGNOSIS — I361 Nonrheumatic tricuspid (valve) insufficiency: Secondary | ICD-10-CM | POA: Diagnosis not present

## 2019-01-28 DIAGNOSIS — I2609 Other pulmonary embolism with acute cor pulmonale: Secondary | ICD-10-CM

## 2019-01-28 DIAGNOSIS — R05 Cough: Secondary | ICD-10-CM | POA: Diagnosis not present

## 2019-01-28 DIAGNOSIS — Z809 Family history of malignant neoplasm, unspecified: Secondary | ICD-10-CM

## 2019-01-28 DIAGNOSIS — R079 Chest pain, unspecified: Secondary | ICD-10-CM | POA: Diagnosis not present

## 2019-01-28 DIAGNOSIS — I272 Pulmonary hypertension, unspecified: Secondary | ICD-10-CM | POA: Diagnosis present

## 2019-01-28 DIAGNOSIS — Z8349 Family history of other endocrine, nutritional and metabolic diseases: Secondary | ICD-10-CM | POA: Diagnosis not present

## 2019-01-28 DIAGNOSIS — I2699 Other pulmonary embolism without acute cor pulmonale: Secondary | ICD-10-CM | POA: Diagnosis not present

## 2019-01-28 DIAGNOSIS — J309 Allergic rhinitis, unspecified: Secondary | ICD-10-CM | POA: Diagnosis present

## 2019-01-28 DIAGNOSIS — A6000 Herpesviral infection of urogenital system, unspecified: Secondary | ICD-10-CM | POA: Diagnosis present

## 2019-01-28 MED ORDER — MORPHINE SULFATE (PF) 4 MG/ML IV SOLN
4.0000 mg | Freq: Once | INTRAVENOUS | Status: AC
  Administered 2019-01-29: 4 mg via INTRAVENOUS
  Filled 2019-01-28: qty 1

## 2019-01-28 MED ORDER — ONDANSETRON HCL 4 MG/2ML IJ SOLN
4.0000 mg | Freq: Once | INTRAMUSCULAR | Status: AC
  Administered 2019-01-29: 4 mg via INTRAVENOUS
  Filled 2019-01-28: qty 2

## 2019-01-28 NOTE — ED Triage Notes (Signed)
Pt has productive cough, sweats and weakness that started several days ago and causing sob. No resp distress is noted at this time.

## 2019-01-28 NOTE — ED Provider Notes (Signed)
[facility] EMERGENCY DEPARTMENT Provider Note   CSN: [id number] Arrival date & time: 01/28/19  1909     History   Chief Complaint Chief Complaint  Patient presents with  . Cough  . Shortness of Breath    HPI Emma Cantu is a 23 y.o. female.  23 year old female presents to the emergency department for evaluation of shortness of breath.  She has had shortness of breath over the past few days with worsening dyspnea on exertion.  Notes a productive cough with intermittent hemoptysis.  She has had some mild nasal congestion as well as night sweats.  Feels generally fatigued and weak.  Also reporting some soreness and discomfort in her left upper back which was slightly improved with Robaxin. Feels like this pain worsens with deep breathing. No fevers, sick contacts, known leg swelling, syncope. She uses Nexplanon for birth control. No personal or FHx of DVT/PE.  The history is provided by the patient and a parent. No language interpreter was used.  Cough  Associated symptoms: shortness of breath   Shortness of Breath  Associated symptoms: cough     Past Medical History:  Diagnosis Date  . Herpes   . Nexplanon insertion 12/2012   Right Arm  . No pertinent past medical history     Patient Active Problem List   Diagnosis Date Noted  . Increased frequency of urination 12/22/2017  . Genital herpes 12/11/2017  . Screen for STD (sexually transmitted disease) 05/23/2017  . Eczema 09/23/2016  . Contraception management 01/27/2013  . ALLERGIC RHINITIS 10/05/2010    Past Surgical History:  Procedure Laterality Date  . HERNIA REPAIR    . inguanal hernia repair    . NO PAST SURGERIES    . ORIF ANKLE FRACTURE Right 01/11/2019   Procedure: OPEN REDUCTION INTERNAL FIXATION (ORIF) ANKLE FRACTURE;  Surgeon: Sheral Apley, MD;  Location: [facility];  Service: Orthopedics;  Laterality: Right;     OB History    Gravida  1   Para  1   Term  1     Preterm  0   AB  0   Living  1     SAB  0   TAB  0   Ectopic  0   Multiple  0   Live Births  1            Home Medications    Prior to Admission medications   Medication Sig Start Date End Date Taking? Authorizing Provider  aspirin EC 81 MG tablet Take 1 tablet (81 mg total) by mouth 2 (two) times daily. For DVT prophylaxis for 30 days after surgery. 01/11/19  Yes Martensen, Lucretia Kern III, PA-C  etonogestrel (NEXPLANON) 68 MG IMPL implant 1 each by Subdermal route once.   Yes [provider]  gabapentin (NEURONTIN) 300 MG capsule Take 1 capsule (300 mg total) by mouth 2 (two) times daily for 14 days. For 2 weeks post op for pain. Patient taking differently: Take 300 mg by mouth 2 (two) times daily as needed (pain). For 2 weeks post op for pain. 01/11/19 01/29/19 Yes Martensen, Lucretia Kern III, PA-C  ibuprofen (ADVIL,MOTRIN) 600 MG tablet Take 600 mg by mouth every 6 (six) hours as needed for moderate pain.   Yes [provider]  methocarbamol (ROBAXIN) 500 MG tablet Take 1 tablet (500 mg total) by mouth every 8 (eight) hours as needed for muscle spasms. 01/11/19  Yes Albina Billet III, PA-C  ondansetron (ZOFRAN) 4 MG tablet Take 1 tablet (4 mg total) by mouth every 8 (eight) hours as needed for nausea or vomiting. 01/11/19  Yes Martensen, Lucretia Kern III, PA-C  oxyCODONE (ROXICODONE) 5 MG immediate release tablet Take 1 tablet (5 mg total) by mouth every 4 (four) hours as needed for up to 30 days for breakthrough pain. 01/11/19 02/10/19 Yes Martensen, Lucretia Kern III, PA-C  valACYclovir (VALTREX) 500 MG tablet Take 1 tablet (500 mg total) by mouth daily. 11/14/18  Yes Wendee Beavers, DO  docusate sodium (COLACE) 100 MG capsule Take 1 capsule (100 mg total) by mouth 2 (two) times daily. To prevent constipation while taking pain medication. Patient not taking: Reported on 01/29/2019 01/11/19   Albina Billet III, PA-C    Family  History Family History  Problem Relation Age of Onset  . Cancer Maternal Grandmother   . Cancer Maternal Grandfather   . Diabetes Paternal Grandmother   . Hyperlipidemia Mother   . Hypertension Mother   . Diabetes Paternal Grandfather   . Stroke Other     Social History Social History   Tobacco Use  . Smoking status: Never Smoker  . Smokeless tobacco: Never Used  Substance Use Topics  . Alcohol use: No  . Drug use: No     Allergies   Patient has no known allergies.   Review of Systems Review of Systems  Respiratory: Positive for cough and shortness of breath.   Ten systems reviewed and are negative for acute change, except as noted in the HPI.    Physical Exam Updated Vital Signs BP 129/74 (BP Location: Left Arm)   Pulse 97   Temp 99.3 F (37.4 C) (Oral)   Resp (!) 22   SpO2 96%   Physical Exam Vitals signs and nursing note reviewed.  Constitutional:      General: She is not in acute distress.    Appearance: She is well-developed. She is not diaphoretic.     Comments: Obese and in NAD. Seems uncomfortable.  HENT:     Head: Normocephalic and atraumatic.  Eyes:     General: No scleral icterus.    Conjunctiva/sclera: Conjunctivae normal.  Neck:     Musculoskeletal: Normal range of motion.  Cardiovascular:     Rate and Rhythm: Regular rhythm. Tachycardia present.     Pulses: Normal pulses.     Comments: 104bpm Pulmonary:     Effort: Pulmonary effort is normal. No respiratory distress.     Breath sounds: No wheezing or rhonchi.     Comments: Pain with inspiration. Chest expansion symmetric. Lungs grossly CTAB. Musculoskeletal: Normal range of motion.     Comments: CAM walker on RLE. No LLE edema.  Skin:    General: Skin is warm and dry.     Coloration: Skin is not pale.     Findings: No erythema or rash.  Neurological:     Mental Status: She is alert and oriented to person, place, and time.  Psychiatric:        Behavior: Behavior normal.       ED Treatments / Results  Labs (all labs ordered are listed, but only abnormal results are displayed) Labs Reviewed  CBC WITH DIFFERENTIAL/PLATELET - Abnormal; Notable for the following components:      Result Value   WBC 14.3 (*)    Neutro Abs 10.6 (*)    All other components within normal limits  BASIC METABOLIC PANEL - Abnormal; Notable for the following components:  Glucose, Bld 112 (*)    All other components within normal limits  PROTIME-INR  HEPARIN LEVEL (UNFRACTIONATED)  I-STAT CREATININE, ED  I-STAT TROPONIN, ED    EKG EKG Interpretation  Date/Time:  Tuesday January 29 2019 02:46:41 EST Ventricular Rate:  98 PR Interval:  160 QRS Duration: 92 QT Interval:  338 QTC Calculation: 431 R Axis:   38 Text Interpretation:  Normal sinus rhythm Cannot rule out Anterior infarct , age undetermined Abnormal ECG No significant change since last tracing Confirmed by Rochele Raring [phone]) on 01/29/2019 2:53:23 AM   Radiology Dg Chest 2 View  Result Date: 01/28/2019 CLINICAL DATA:  Chest pain, shortness of breath, coughing up blood for 1 day EXAM: CHEST - 2 VIEW COMPARISON:  None. FINDINGS: There is hazy left lower lobe airspace disease which may reflect atelectasis versus pneumonia. There is no pleural effusion or pneumothorax. The heart and mediastinal contours are unremarkable. The osseous structures are unremarkable. IMPRESSION: Hazy left lower lobe airspace disease which may reflect atelectasis versus pneumonia. Electronically Signed   By: Elige Ko   On: 01/28/2019 21:04   Ct Angio Chest Pe W And/or Wo Contrast  Result Date: 01/29/2019 CLINICAL DATA:  Chest pain with shortness of breath that began this morning. EXAM: CT ANGIOGRAPHY CHEST WITH CONTRAST TECHNIQUE: Multidetector CT imaging of the chest was performed using the standard protocol during bolus administration of intravenous contrast. Multiplanar CT image reconstructions and MIPs were obtained to evaluate the  vascular anatomy. CONTRAST:  ISOVUE-370 IOPAMIDOL (ISOVUE-370) INJECTION 76% COMPARISON:  None. FINDINGS: Cardiovascular: Satisfactory opacification of the pulmonary arteries to the segmental level. Pulmonary emboli in the right lower lobe and right middle lobe lobar and segmental branches. Extensive left lower lobar, segmental and subsegmental pulmonary emboli. Normal heart size. Right heart strain. No pericardial effusion. Mediastinum/Nodes: No enlarged mediastinal, hilar, or axillary lymph nodes. Thyroid gland, trachea, and esophagus demonstrate no significant findings. Lungs/Pleura: No pleural effusion or pneumothorax. Left lower lobe peripheral airspace disease which may reflect atelectasis versus developing pulmonary infarct. Upper Abdomen: No acute abnormality. Musculoskeletal: No chest wall abnormality. No acute or significant osseous findings. Review of the MIP images confirms the above findings. IMPRESSION: 1. Bilateral acute pulmonary emboli. Positive for acute PE with CT evidence of right heart strain (RV/LV Ratio = 1.2) consistent with at least submassive (intermediate risk) PE. The presence of right heart strain has been associated with an increased risk of morbidity and mortality. Please activate Code PE by paging [phone]. 2. Left lower lobe peripheral airspace disease which may reflect atelectasis versus developing pulmonary infarct. Critical Value/emergent results were called by telephone at the time of interpretation on 01/29/2019 at 1:28 am to [facility] , who verbally acknowledged these results. Electronically Signed   By: Elige Ko   On: 01/29/2019 01:29    Procedures Procedures (including critical care time)  Medications Ordered in ED Medications  iopamidol (ISOVUE-370) 76 % injection (has no administration in time range)  HYDROmorphone (DILAUDID) injection 1 mg (has no administration in time range)  heparin bolus via infusion 5,000 Units (has no administration in time  range)  heparin ADULT infusion 100 units/mL (25000 units/236mL sodium chloride 0.45%) (has no administration in time range)  morphine 4 MG/ML injection 4 mg (4 mg Intravenous Given 01/29/19 0022)  ondansetron (ZOFRAN) injection 4 mg (4 mg Intravenous Given 01/29/19 0022)  iopamidol (ISOVUE-370) 76 % injection 100 mL (100 mLs Intravenous Contrast Given 01/29/19 0109)    CRITICAL CARE Performed by: Antony Madura  Total critical care time: 45 minutes  Critical care time was exclusive of separately billable procedures and treating other patients.  Critical care was necessary to treat or prevent imminent or life-threatening deterioration.  Critical care was time spent personally by me on the following activities: development of treatment plan with patient and/or surrogate as well as nursing, discussions with consultants, evaluation of patient's response to treatment, examination of patient, obtaining history from patient or surrogate, ordering and performing treatments and interventions, ordering and review of laboratory studies, ordering and review of radiographic studies, pulse oximetry and re-evaluation of patient's condition.   Initial Impression / Assessment and Plan / ED Course  I have reviewed the triage vital signs and the nursing notes.  Pertinent labs & imaging results that were available during my care of the patient were reviewed by me and considered in my medical decision making (see chart for details).     11:57 PM 23 y/o female presenting for worsening SOB and DOE. Xray with findings of atelectasis vs PNA. Afebrile in the ED. Patient with high pretest probability for PE. Not only has patient had recent surgery on 01/11/19 with continued NWB, but she also uses Nexplanon with sats of 94% on arrival, tachycardia to 104bpm, and reports of hemoptysis.   Will forego D dimer given high pretest probability. Discussed plans for CT with patient who is agreeable.  1:40 AM Patient updated on  imaging results. Will start on heparin. Pain down to 5/10 from 10/10. Requesting additional pain medication. Will give Dilaudid 1mg . Plan for admission after basic labs resulted.  2:41 AM Patient with leukocytosis likely secondary to acute PE with developing pulmonary infarct.  Her troponin is negative.  She has been hemodynamically stable since arrival without acute hypoxia.  Plan for admission to family practice service.   Vitals:   01/28/19 1913 01/29/19 0251  BP: 127/78 129/74  Pulse:  97  Resp: 20 (!) 22  Temp: 99.3 F (37.4 C)   SpO2: 94% 96%    Final Clinical Impressions(s) / ED Diagnoses   Final diagnoses:  Other acute pulmonary embolism with acute cor pulmonale [facility](HCC)    ED Discharge Orders    None       Antony MaduraHumes, Alexismarie Flaim, PA-C 01/29/19 0257    Ward, Layla MawKristen N, DO 01/29/19 [phone]

## 2019-01-29 ENCOUNTER — Encounter (HOSPITAL_COMMUNITY): Payer: Self-pay | Admitting: Radiology

## 2019-01-29 ENCOUNTER — Emergency Department (HOSPITAL_COMMUNITY): Payer: BLUE CROSS/BLUE SHIELD

## 2019-01-29 DIAGNOSIS — Z7982 Long term (current) use of aspirin: Secondary | ICD-10-CM | POA: Diagnosis not present

## 2019-01-29 DIAGNOSIS — Z833 Family history of diabetes mellitus: Secondary | ICD-10-CM | POA: Diagnosis not present

## 2019-01-29 DIAGNOSIS — Z823 Family history of stroke: Secondary | ICD-10-CM | POA: Diagnosis not present

## 2019-01-29 DIAGNOSIS — Z79899 Other long term (current) drug therapy: Secondary | ICD-10-CM | POA: Diagnosis not present

## 2019-01-29 DIAGNOSIS — R0602 Shortness of breath: Secondary | ICD-10-CM | POA: Diagnosis not present

## 2019-01-29 DIAGNOSIS — I2699 Other pulmonary embolism without acute cor pulmonale: Secondary | ICD-10-CM | POA: Diagnosis present

## 2019-01-29 DIAGNOSIS — Z86711 Personal history of pulmonary embolism: Secondary | ICD-10-CM | POA: Diagnosis not present

## 2019-01-29 DIAGNOSIS — Z23 Encounter for immunization: Secondary | ICD-10-CM | POA: Diagnosis not present

## 2019-01-29 DIAGNOSIS — Z8249 Family history of ischemic heart disease and other diseases of the circulatory system: Secondary | ICD-10-CM | POA: Diagnosis not present

## 2019-01-29 DIAGNOSIS — Z6839 Body mass index (BMI) 39.0-39.9, adult: Secondary | ICD-10-CM | POA: Diagnosis not present

## 2019-01-29 DIAGNOSIS — L309 Dermatitis, unspecified: Secondary | ICD-10-CM | POA: Diagnosis present

## 2019-01-29 DIAGNOSIS — A6000 Herpesviral infection of urogenital system, unspecified: Secondary | ICD-10-CM | POA: Diagnosis present

## 2019-01-29 DIAGNOSIS — I361 Nonrheumatic tricuspid (valve) insufficiency: Secondary | ICD-10-CM | POA: Diagnosis not present

## 2019-01-29 DIAGNOSIS — Z809 Family history of malignant neoplasm, unspecified: Secondary | ICD-10-CM | POA: Diagnosis not present

## 2019-01-29 DIAGNOSIS — E669 Obesity, unspecified: Secondary | ICD-10-CM | POA: Diagnosis present

## 2019-01-29 DIAGNOSIS — J309 Allergic rhinitis, unspecified: Secondary | ICD-10-CM | POA: Diagnosis present

## 2019-01-29 DIAGNOSIS — Z8349 Family history of other endocrine, nutritional and metabolic diseases: Secondary | ICD-10-CM | POA: Diagnosis not present

## 2019-01-29 DIAGNOSIS — I272 Pulmonary hypertension, unspecified: Secondary | ICD-10-CM | POA: Diagnosis present

## 2019-01-29 LAB — CBC WITH DIFFERENTIAL/PLATELET
Abs Immature Granulocytes: 0.05 10*3/uL (ref 0.00–0.07)
Basophils Absolute: 0 10*3/uL (ref 0.0–0.1)
Basophils Relative: 0 %
Eosinophils Absolute: 0.2 10*3/uL (ref 0.0–0.5)
Eosinophils Relative: 1 %
HCT: 39.5 % (ref 36.0–46.0)
Hemoglobin: 12.9 g/dL (ref 12.0–15.0)
Immature Granulocytes: 0 %
Lymphocytes Relative: 18 %
Lymphs Abs: 2.5 10*3/uL (ref 0.7–4.0)
MCH: 29.5 pg (ref 26.0–34.0)
MCHC: 32.7 g/dL (ref 30.0–36.0)
MCV: 90.4 fL (ref 80.0–100.0)
MONO ABS: 1 10*3/uL (ref 0.1–1.0)
Monocytes Relative: 7 %
Neutro Abs: 10.6 10*3/uL — ABNORMAL HIGH (ref 1.7–7.7)
Neutrophils Relative %: 74 %
Platelets: 274 10*3/uL (ref 150–400)
RBC: 4.37 MIL/uL (ref 3.87–5.11)
RDW: 13.2 % (ref 11.5–15.5)
WBC: 14.3 10*3/uL — ABNORMAL HIGH (ref 4.0–10.5)
nRBC: 0 % (ref 0.0–0.2)

## 2019-01-29 LAB — BASIC METABOLIC PANEL
Anion gap: 8 (ref 5–15)
BUN: 18 mg/dL (ref 6–20)
CALCIUM: 9.3 mg/dL (ref 8.9–10.3)
CO2: 23 mmol/L (ref 22–32)
Chloride: 104 mmol/L (ref 98–111)
Creatinine, Ser: 0.79 mg/dL (ref 0.44–1.00)
GFR calc Af Amer: >60 mL/min (ref >60)
GFR calc non Af Amer: >60 mL/min (ref >60)
Glucose, Bld: 112 mg/dL — ABNORMAL HIGH (ref 70–99)
Potassium: 4.2 mmol/L (ref 3.5–5.1)
Sodium: 135 mmol/L (ref 135–145)

## 2019-01-29 LAB — PROTIME-INR
INR: 1.08
Prothrombin Time: 13.9 seconds (ref 11.4–15.2)

## 2019-01-29 LAB — I-STAT CREATININE, ED: Creatinine, Ser: 0.6 mg/dL (ref 0.44–1.00)

## 2019-01-29 LAB — I-STAT TROPONIN, ED: Troponin i, poc: 0.01 ng/mL (ref 0.00–0.08)

## 2019-01-29 LAB — TROPONIN I
Troponin I: <0.03 ng/mL (ref <0.03)
Troponin I: <0.03 ng/mL (ref <0.03)

## 2019-01-29 MED ORDER — HYDROMORPHONE HCL 1 MG/ML IJ SOLN
1.0000 mg | Freq: Once | INTRAMUSCULAR | Status: AC
  Administered 2019-01-29: 1 mg via INTRAVENOUS
  Filled 2019-01-29: qty 1

## 2019-01-29 MED ORDER — ACETAMINOPHEN 325 MG PO TABS
650.0000 mg | ORAL_TABLET | Freq: Four times a day (QID) | ORAL | Status: DC | PRN
  Administered 2019-01-29 (×2): 650 mg via ORAL
  Filled 2019-01-29 (×2): qty 2

## 2019-01-29 MED ORDER — INFLUENZA VAC SPLIT QUAD 0.5 ML IM SUSY
0.5000 mL | PREFILLED_SYRINGE | INTRAMUSCULAR | Status: AC | PRN
  Administered 2019-01-30: 0.5 mL via INTRAMUSCULAR
  Filled 2019-01-29: qty 0.5

## 2019-01-29 MED ORDER — OXYCODONE HCL 5 MG PO TABS
5.0000 mg | ORAL_TABLET | ORAL | Status: DC | PRN
  Administered 2019-01-29 – 2019-01-30 (×7): 5 mg via ORAL
  Filled 2019-01-29 (×7): qty 1

## 2019-01-29 MED ORDER — IOPAMIDOL (ISOVUE-370) INJECTION 76%
100.0000 mL | Freq: Once | INTRAVENOUS | Status: AC | PRN
  Administered 2019-01-29: 100 mL via INTRAVENOUS

## 2019-01-29 MED ORDER — APIXABAN 5 MG PO TABS
10.0000 mg | ORAL_TABLET | Freq: Two times a day (BID) | ORAL | Status: DC
  Administered 2019-01-29 – 2019-01-30 (×3): 10 mg via ORAL
  Filled 2019-01-29 (×3): qty 2

## 2019-01-29 MED ORDER — VALACYCLOVIR HCL 500 MG PO TABS: 1000.0000 mg | ORAL_TABLET | Freq: Every day | ORAL | Status: DC

## 2019-01-29 MED ORDER — IOPAMIDOL (ISOVUE-370) INJECTION 76%
INTRAVENOUS | Status: AC
  Filled 2019-01-29: qty 100

## 2019-01-29 MED ORDER — HEPARIN BOLUS VIA INFUSION
5000.0000 [IU] | Freq: Once | INTRAVENOUS | Status: AC
  Administered 2019-01-29: 5000 [IU] via INTRAVENOUS
  Filled 2019-01-29: qty 5000

## 2019-01-29 MED ORDER — APIXABAN 5 MG PO TABS: 5.0000 mg | ORAL_TABLET | Freq: Two times a day (BID) | ORAL | Status: DC

## 2019-01-29 MED ORDER — VALACYCLOVIR HCL 500 MG PO TABS
500.0000 mg | ORAL_TABLET | Freq: Every day | ORAL | Status: DC
  Administered 2019-01-29 – 2019-01-30 (×2): 500 mg via ORAL
  Filled 2019-01-29 (×2): qty 1

## 2019-01-29 MED ORDER — HEPARIN (PORCINE) 25000 UT/250ML-% IV SOLN
1200.0000 [IU]/h | INTRAVENOUS | Status: DC
  Administered 2019-01-29: 1200 [IU]/h via INTRAVENOUS
  Filled 2019-01-29: qty 250

## 2019-01-29 MED ORDER — ACETAMINOPHEN 650 MG RE SUPP: 650.0000 mg | Freq: Four times a day (QID) | RECTAL | Status: DC | PRN

## 2019-01-29 NOTE — ED Notes (Signed)
Breakfast tray ordered 

## 2019-01-29 NOTE — Discharge Summary (Addendum)
Family Medicine Teaching Service  Discharge Note: Attending Levert Feinstein MD  I have seen and examined this patient, reviewed this patient and the patient's chart and have discussed discharge planning with the resident at the time of discharge. I agree with the discharge plan as above.   -----------------------------------------------------  Family Medicine Teaching [facility] Discharge Summary  Patient name: Emma Cantu Medical record number: [id number] Date of birth: Dec 18, 1996 Age: 23 y.o. Gender: female Date of Admission: 01/28/2019  Date of Discharge: 01/30/2019 Admitting Physician: Nestor Ramp, MD  Primary Care Provider: Wendee Beavers, DO Consultants: N/A  Indication for Hospitalization: Bilateral pulmonary embolism  Discharge Diagnoses/Problem List:  Pulmonary embolism Genital herpes Allergic rhinitis Pulmonary hypertension  Disposition: Home  Discharge Condition: Stable on room air. Endorses back pain with movement.  Discharge Exam:  General: Lying in bed with Hooper in place, on 0 L Cardiovascular: RRR. No m/r/g.  Respiratory: Distant breath sounds, likely due to poor effort due to pain.  Normal work of breathing on room air.  Gastrointestinal: Soft, obese abdomen, nontender to palpation.  Positive bowel sounds. No CVA tenderness.  MSK: Pain in mid back that is improved with palpation. Neuro: Alert and oriented x3 Psych: Appropriate mood and affect  Brief Hospital Course:  Ms. Shostak presented on 01/28/19 with acute onset SOB and back pain after prolonged immobilization since ankle surgery on 01/11/19. She was found on CT to have bilateral acute pulmonary emboli with R heart strain. She was treated with heparin drip and morphine with improvement in respiratory status. On HOD #1 she was transitioned to oral Elliquis. She was weaned off of supplemental oxygen and ambulated by PT while maintaining oxygen saturation 92%. Plan for 3 months of anticoagulation for  provoked PE.  A echocardiogram was obtained to assess for right heart strain.  No right heart strain was noted on the exam.  However, incidentally, was found to have mild pulmonary hypertension.  F/U with Family Medicine scheduled 02/01/19.  Issues for Follow Up:  1. Follow-up on anticoagulation. Eliquis 10 mg bid until 2/10, then Eliquis 5 mg bid starting 2/11. Total 3 months.   Significant Procedures: None  Significant Labs and Imaging:  Recent Labs  Lab 01/29/19 0154 01/30/19 0914  WBC 14.3* 14.8*  HGB 12.9 12.7  HCT 39.5 40.1  PLT 274 317   Recent Labs  Lab 01/29/19 0041 01/29/19 0154 01/30/19 0914  NA  --  135 135  K  --  4.2 4.1  CL  --  104 102  CO2  --  23 21*  GLUCOSE  --  112* 116*  BUN  --  18 9  CREATININE 0.60 0.79 0.81  CALCIUM  --  9.3 9.4      Results/Tests Pending at Time of Discharge: None  Discharge Medications:  Allergies as of 01/30/2019   No Known Allergies     Medication List    STOP taking these medications   aspirin EC 81 MG tablet   docusate sodium 100 MG capsule Commonly known as:  COLACE   gabapentin 300 MG capsule Commonly known as:  NEURONTIN   ibuprofen 600 MG tablet Commonly known as:  ADVIL,MOTRIN   ondansetron 4 MG tablet Commonly known as:  ZOFRAN   oxyCODONE 5 MG immediate release tablet Commonly known as:  ROXICODONE     TAKE these medications   ELIQUIS STARTER PACK 5 MG Tabs Take as directed on package: start with two-5mg  tablets twice daily for 7 days. On day  8, switch to one-5mg  tablet twice daily.   Influenza vac split quadrivalent PF 0.5 ML injection Commonly known as:  FLUARIX Inject 0.5 mLs into the muscle once for 1 dose.   methocarbamol 500 MG tablet Commonly known as:  ROBAXIN Take 1 tablet (500 mg total) by mouth every 8 (eight) hours as needed for muscle spasms.   NEXPLANON 68 MG Impl implant Generic drug:  etonogestrel 1 each by Subdermal route once.   valACYclovir 500 MG tablet Commonly known  as:  VALTREX Take 1 tablet (500 mg total) by mouth daily.       Discharge Instructions: Please refer to Patient Instructions section of EMR for full details.  Patient was counseled important signs and symptoms that should prompt return to medical care, changes in medications, dietary instructions, activity restrictions, and follow up appointments.   Follow-Up Appointments: Follow-up Information    Shirley, [place], DO. Go on 02/01/2019.   Specialty:  Family Medicine Why:  2:30pm Contact information: [address] [place] [address] [phone]          Dr. Lovena Neighbours was personally present and performed or re-performed the history, physical exam. Medical decision making activities reviewed by myself such that the findings are accurately documented in the student's note with the exception that addendums were documented in BLUE.    Garnette Gunner, MD 01/30/2019, 2:12 PM    [facility]

## 2019-01-29 NOTE — ED Notes (Signed)
Heart Healthy Diet was ordered for Lunch. 

## 2019-01-29 NOTE — Care Management (Signed)
#  8   S/W  CRAIG  @ [employer] RX # [phone]   APIXABAN : NONE FORMULARY  ELIQUIS  10 MG  BID : NONE FORMULARY  1. ELIQUIS  2.5 MG BID COVER- YES CO-PAY- $ 80.00 TIER- NO PRIOR APPROVAL- YES # [phone]  2. ELIQUIS  5 MG BID COVER- YES CO-PAY- $ 80.00 TIER- NO PRIOR APPROVAL- YES # [phone]  RIVAROXABAN : NONE FORMULARY  3. XARELTO 15 MG BID COVER- YES CO-PAY- $ 80.00 TIER- NO PRIOR APPROVAL- YES  # [phone]  4. XARELTO 20 MG DAILY COVER- YES CO-PAY- $ 80.00 TIER- NO PRIOR APPROVAL- YES #  [phone]  NO DEDUCTIBLE OUT-OF-POCKET: NOT MET  PREFERRED PHARMACY : YES [facility] AND [facility]

## 2019-01-29 NOTE — Evaluation (Signed)
Physical Therapy Evaluation Patient Details Name: Emma Cantu MRN: [id number] DOB: 06/10/1996 Today's Date: 01/29/2019   History of Present Illness  Pt is a 23 y.o. female s/p recent ankle ORIF (01/11/19) admitted 01/29/19 with SOB and back pain. Chest CTA showed bilateral PE with resultant R heart strain; questioned developing pulmonary infarct versus atelectasis. PMH includes allergic rhinitis.    Clinical Impression  Pt presents with an overall decrease in functional mobility secondary to above. PTA, pt has been using RW to maintain R foot NWB since recent ankle ORIF (12/2018); lives with supportive family. Today, pt unable to get OOB secondary to c/o significant chest and back pain similar to pain onset at admission. SpO2 down to 88% on RA, maintaining >94% on 1.5L O2 [place]. Pt also with c/o headache with mobility attempts; BP 134/77; RN aware. Pt would benefit from continued acute PT services to maximize functional mobility and independence prior to d/c home.     Follow Up Recommendations TBD; Supervision for mobility/OOB    Equipment Recommendations  None recommended by PT    Recommendations for Other Services       Precautions / Restrictions Precautions Precautions: Fall Required Braces or Orthoses: Other Brace Other Brace: R cam walker boot Restrictions Weight Bearing Restrictions: Yes RLE Weight Bearing: Non weight bearing Other Position/Activity Restrictions: Ankle fx s/p ORIF (01/11/19) - NWB per pt      Mobility  Bed Mobility Overal bed mobility: Needs Assistance Bed Mobility: Supine to Sit;Sit to Supine     Supine to sit: Min assist;HOB elevated Sit to supine: Min assist;HOB elevated   General bed mobility comments: Crying from c/o back and chest pain when attempting to sit; minA to support trunk; moves RLE well. SpO2 88-92% on RA  Transfers                 General transfer comment: NT secondary to pain  Ambulation/Gait                Stairs             Wheelchair Mobility    Modified Rankin (Stroke Patients Only)       Balance Overall balance assessment: Needs assistance                                           Pertinent Vitals/Pain Pain Assessment: Faces Faces Pain Scale: Hurts whole lot Pain Location: L-side chest and back Pain Descriptors / Indicators: Sharp;Crying;Grimacing Pain Intervention(s): Limited activity within patient's tolerance;Monitored during session;Premedicated before session    Home Living Family/patient expects to be discharged to:: Private residence Living Arrangements: Parent Available Help at Discharge: Family;Available PRN/intermittently Type of Home: House Home Access: Stairs to enter Entrance Stairs-Rails: Right Entrance Stairs-Number of Steps: 5 Home Layout: One level Home Equipment: Walker - 2 wheels;Crutches;Shower seat      Prior Function Level of Independence: Independent with assistive device(s)         Comments: Has been using RW to maintain R foot NWB since ankle ORIF in 01/11/2019. Prefers RW over crutches. Mom drives     Hand Dominance        Extremity/Trunk Assessment   Upper Extremity Assessment Upper Extremity Assessment: Overall WFL for tasks assessed    Lower Extremity Assessment Lower Extremity Assessment: RLE deficits/detail RLE Deficits / Details: s/p R ankle ORIF (12/2018); able to perform SLR  Communication   Communication: No difficulties  Cognition Arousal/Alertness: Awake/alert Behavior During Therapy: WFL for tasks assessed/performed Overall Cognitive Status: Within Functional Limits for tasks assessed                                        General Comments General comments (skin integrity, edema, etc.): Mother and friends present during session    Exercises Other Exercises Other Exercises: Discussed importance of BLE AROM, including L ankle pumps, bilat knee flex/ext, hip flex/abd/add    Assessment/Plan    PT Assessment Patient needs continued PT services  PT Problem List Decreased strength;Decreased range of motion;Decreased activity tolerance;Decreased balance;Decreased mobility;Pain;Cardiopulmonary status limiting activity       PT Treatment Interventions DME instruction;Gait training;Stair training;Functional mobility training;Therapeutic activities;Therapeutic exercise;Balance training;Patient/family education    PT Goals (Current goals can be found in the Care Plan section)  Acute Rehab PT Goals Patient Stated Goal: Less pain  PT Goal Formulation: With patient Time For Goal Achievement: 02/12/19 Potential to Achieve Goals: Good    Frequency Min 3X/week   Barriers to discharge        Co-evaluation               AM-PAC PT "6 Clicks" Mobility  Outcome Measure Help needed turning from your back to your side while in a flat bed without using bedrails?: A Little Help needed moving from lying on your back to sitting on the side of a flat bed without using bedrails?: A Little Help needed moving to and from a bed to a chair (including a wheelchair)?: A Little Help needed standing up from a chair using your arms (e.g., wheelchair or bedside chair)?: A Little Help needed to walk in hospital room?: A Lot Help needed climbing 3-5 steps with a railing? : A Lot 6 Click Score: 16    End of Session   Activity Tolerance: Patient limited by pain Patient left: in bed;with call bell/phone within reach;with family/visitor present Nurse Communication: Mobility status PT Visit Diagnosis: Other abnormalities of gait and mobility (R26.89)    Time: 6503-5465 PT Time Calculation (min) (ACUTE ONLY): 20 min   Charges:   PT Evaluation $PT Eval Low Complexity: 1 Low         Ina Homes, PT, DPT Acute Rehabilitation Services  Pager [phone] Office [phone]  Malachy Chamber 01/29/2019, 4:29 PM

## 2019-01-29 NOTE — ED Notes (Signed)
Patient transported to CT 

## 2019-01-29 NOTE — ED Notes (Signed)
Pt getting into gown and using bedside commode. Is SOB with exertion. Sats maintain on 2 L [place].

## 2019-01-29 NOTE — H&P (Addendum)
Family Medicine [facility] Admission History and Physical Service Pager: [phone]  Patient name: Emma Cantu Medical record number: [id number] Date of birth: Feb 13, 1996 Age: 23 y.o. Gender: female  Primary Care Provider: Wendee Beavers, DO Consultants: None Code Status: Full  Chief Complaint: shortness of breath  Assessment and Plan: Emma Cantu is a 23 y.o. female presenting with shortness of breath and back pain. PMH is significant for Allergic Rhinitis, eczema, genital herpes.  Acute PE Presented with shortness of breath, sharp chest and back pain, and hemoptysis following immobilization from ankle surgery 01/11/2019.  Patient is also on contraception with Nexplanon.  Well score 5.5. Given this high risk criteria a d-dimer was not obtained in the ED.  CTA chest showed bilateral acute pulmonary emboli with resultant right heart strain and questioned developing pulmonary infarct versus atelectasis.  Patient denies any prior symptoms of DVT, no obvious signs on exam.  Patient is requiring supplemental oxygen, saturated at 98% although tachypneic to 25.  Vitals otherwise are stable.  Patient does not smoke, no prior DVTs or PEs in the past.  No history of malignancy.  Patient was started on heparin drip in the ED. -Admit to telemetry, attending Dr. Jennette Kettle. - Continue heparin gtt - Echo - Trend trops - Cardiac monitoring with continuous pulse ox - Supplemental oxygen as needed - Will eventually need to take out Nexplanon  Allergic rhinitis  eczema Well controlled without daily medications.  Asymptomatic on admission. - Monitor  Genital herpes Takes 500 mg of Valtrex daily.  On admission patient states she feels she is getting an outbreak. - Valtrex 1 g daily x5 days  FEN/GI: Heart healthy Prophylaxis: Heparin drip  Disposition: Admit to telemetry, attending Dr. Jennette Kettle  History of Present Illness:  Emma Cantu is a 23 y.o. female presenting with shortness of  breath. Patient had surgery on her ankle 01/11/19. She then felt a sharp pain in her chest around the time of her surgery but went away. A few days ago she woke up to use the bathroom and noticed some SOB, but thought it was because she hadn't been moving around very much. Today she had excruciating L sided back pain with associated cough and hemoptysis x2 yesterday. She took a muscle relaxer with some relief in her pain but still did not feel right so she came to the hospital. States pain is constant but improved, L>R. She states she has to take a pause when talking due to shortness of breath. Has been walking with a walker since the surgery but admits she has not been very mobile. She feels like her heart would beat fast when she walks. She denied having pain with walking prior to shortness of breath. Felt tingling in legs but denied swelling in legs or arms prior to shortness of breath. Received methacarbamol, oxycodone, gabapentin, ASA 81mg , ibuprofen 600mg , stool softener after her surgery. She states she was told to keep feet propped up for 6 weeks after her surgery to prevent swelling. Had f/u appt 1/31 with Ortho, ankle was healing well at that time. Has previous h/o preeclampsia. Had night sweats Sunday night.  Endorsed some headaches, but attributes to nexplanon (is due to be taken out June 2020). Is currently having breakthrough vaginal bleeding.  Does not smoke, no alcohol use. Lives with mom.   Review Of Systems: Per HPI with the following additions:   Review of Systems  Constitutional: Positive for diaphoresis. Negative for chills and fever.  HENT: Negative  for sore throat.   Respiratory: Positive for cough and hemoptysis.   Cardiovascular: Positive for chest pain.  Gastrointestinal: Negative for abdominal pain, diarrhea, nausea and vomiting.  Genitourinary: Negative for dysuria.  Skin: Negative for rash.  Neurological: Negative for dizziness.   Patient Active Problem List   Diagnosis  Date Noted  . Pulmonary emboli (HCC) 01/29/2019  . Increased frequency of urination 12/22/2017  . Genital herpes 12/11/2017  . Screen for STD (sexually transmitted disease) 05/23/2017  . Eczema 09/23/2016  . Contraception management 01/27/2013  . ALLERGIC RHINITIS 10/05/2010   Past Medical History: Past Medical History:  Diagnosis Date  . Herpes   . Nexplanon insertion 12/2012   Right Arm  . No pertinent past medical history    Past Surgical History: Past Surgical History:  Procedure Laterality Date  . HERNIA REPAIR    . inguanal hernia repair    . NO PAST SURGERIES    . ORIF ANKLE FRACTURE Right 01/11/2019   Procedure: OPEN REDUCTION INTERNAL FIXATION (ORIF) ANKLE FRACTURE;  Surgeon: Sheral ApleyMurphy, Timothy D, MD;  Location: [facility];  Service: Orthopedics;  Laterality: Right;   Social History: Social History   Tobacco Use  . Smoking status: Never Smoker  . Smokeless tobacco: Never Used  Substance Use Topics  . Alcohol use: No  . Drug use: No   Additional social history: lives at home with mom  Please also refer to relevant sections of EMR.  Family History: Family History  Problem Relation Age of Onset  . Cancer Maternal Grandmother   . Cancer Maternal Grandfather   . Diabetes Paternal Grandmother   . Hyperlipidemia Mother   . Hypertension Mother   . Diabetes Paternal Grandfather   . Stroke Other    Allergies and Medications: No Known Allergies No current facility-administered medications on file prior to encounter.    Current Outpatient Medications on File Prior to Encounter  Medication Sig Dispense Refill  . aspirin EC 81 MG tablet Take 1 tablet (81 mg total) by mouth 2 (two) times daily. For DVT prophylaxis for 30 days after surgery. 60 tablet 0  . etonogestrel (NEXPLANON) 68 MG IMPL implant 1 each by Subdermal route once.    . gabapentin (NEURONTIN) 300 MG capsule Take 1 capsule (300 mg total) by mouth 2 (two) times daily for 14 days. For 2 weeks  post op for pain. (Patient taking differently: Take 300 mg by mouth 2 (two) times daily as needed (pain). For 2 weeks post op for pain.) 28 capsule 0  . ibuprofen (ADVIL,MOTRIN) 600 MG tablet Take 600 mg by mouth every 6 (six) hours as needed for moderate pain.    . methocarbamol (ROBAXIN) 500 MG tablet Take 1 tablet (500 mg total) by mouth every 8 (eight) hours as needed for muscle spasms. 40 tablet 0  . ondansetron (ZOFRAN) 4 MG tablet Take 1 tablet (4 mg total) by mouth every 8 (eight) hours as needed for nausea or vomiting. 20 tablet 0  . oxyCODONE (ROXICODONE) 5 MG immediate release tablet Take 1 tablet (5 mg total) by mouth every 4 (four) hours as needed for up to 30 days for breakthrough pain. 40 tablet 0  . valACYclovir (VALTREX) 500 MG tablet Take 1 tablet (500 mg total) by mouth daily. 90 tablet 2  . docusate sodium (COLACE) 100 MG capsule Take 1 capsule (100 mg total) by mouth 2 (two) times daily. To prevent constipation while taking pain medication. (Patient not taking: Reported on 01/29/2019) 60 capsule  0   Objective: BP 113/74   Pulse 93   Temp 99.3 F (37.4 C) (Oral)   Resp (!) 25   SpO2 98%  Exam: General: Obese female lying in bed in mild distress. Eyes: PERRL, EOMI ENTM: MMM, fair dentition Cardiovascular: Slightly tachycardic, regular rhythm.  No murmur.  Intact distal pulses. Respiratory: Distant breath sounds, likely due to poor effort due to pain.  Appropriately saturated on 2L via [place] with normal work of breathing Gastrointestinal: Soft, obese abdomen, nontender to palpation.  Positive bowel sounds. MSK: Unable to fully assess strength due to pain. Derm: No rashes or lesions noted.  No erythema or swelling of extremities. Neuro: Alert and oriented x3 Psych: Appropriate mood and affect  Labs and Imaging: CBC BMET  Recent Labs  Lab 01/29/19 0154  WBC 14.3*  HGB 12.9  HCT 39.5  PLT 274   Recent Labs  Lab 01/29/19 0154  NA 135  K 4.2  CL 104  CO2 23  BUN 18   CREATININE 0.79  GLUCOSE 112*  CALCIUM 9.3     Dg Chest 2 View  Result Date: 01/28/2019 CLINICAL DATA:  Chest pain, shortness of breath, coughing up blood for 1 day EXAM: CHEST - 2 VIEW COMPARISON:  None. FINDINGS: There is hazy left lower lobe airspace disease which may reflect atelectasis versus pneumonia. There is no pleural effusion or pneumothorax. The heart and mediastinal contours are unremarkable. The osseous structures are unremarkable. IMPRESSION: Hazy left lower lobe airspace disease which may reflect atelectasis versus pneumonia. Electronically Signed   By: Elige Ko   On: 01/28/2019 21:04   Ct Angio Chest Pe W And/or Wo Contrast  Result Date: 01/29/2019 CLINICAL DATA:  Chest pain with shortness of breath that began this morning. EXAM: CT ANGIOGRAPHY CHEST WITH CONTRAST TECHNIQUE: Multidetector CT imaging of the chest was performed using the standard protocol during bolus administration of intravenous contrast. Multiplanar CT image reconstructions and MIPs were obtained to evaluate the vascular anatomy. CONTRAST:  ISOVUE-370 IOPAMIDOL (ISOVUE-370) INJECTION 76% COMPARISON:  None. FINDINGS: Cardiovascular: Satisfactory opacification of the pulmonary arteries to the segmental level. Pulmonary emboli in the right lower lobe and right middle lobe lobar and segmental branches. Extensive left lower lobar, segmental and subsegmental pulmonary emboli. Normal heart size. Right heart strain. No pericardial effusion. Mediastinum/Nodes: No enlarged mediastinal, hilar, or axillary lymph nodes. Thyroid gland, trachea, and esophagus demonstrate no significant findings. Lungs/Pleura: No pleural effusion or pneumothorax. Left lower lobe peripheral airspace disease which may reflect atelectasis versus developing pulmonary infarct. Upper Abdomen: No acute abnormality. Musculoskeletal: No chest wall abnormality. No acute or significant osseous findings. Review of the MIP images confirms the above  findings. IMPRESSION: 1. Bilateral acute pulmonary emboli. Positive for acute PE with CT evidence of right heart strain (RV/LV Ratio = 1.2) consistent with at least submassive (intermediate risk) PE. The presence of right heart strain has been associated with an increased risk of morbidity and mortality. Please activate Code PE by paging [phone]. 2. Left lower lobe peripheral airspace disease which may reflect atelectasis versus developing pulmonary infarct. Critical Value/emergent results were called by telephone at the time of interpretation on 01/29/2019 at 1:28 am to [facility] , who verbally acknowledged these results. Electronically Signed   By: Elige Ko   On: 01/29/2019 01:29   Ellwood Dense, DO 01/29/2019, 6:32 AM PGY-2, [facility] FPTS Intern pager: [phone], text pages welcome

## 2019-01-29 NOTE — Progress Notes (Signed)
ANTICOAGULATION CONSULT NOTE  Pharmacy Consult for Heparin --> apixaban Indication: pulmonary embolus  No Known Allergies  Vital Signs: BP: 116/67 (02/04 0830) Pulse Rate: 93 (02/04 0830)  Labs: Recent Labs    01/29/19 0041 01/29/19 0154  HGB  --  12.9  HCT  --  39.5  PLT  --  274  LABPROT  --  13.9  INR  --  1.08  CREATININE 0.60 0.79    CrCl cannot be calculated (Unknown ideal weight.).   Assessment: 23 y/o F with recent ORIF ankle, presents to the ED with shortness of breath, only on ASA for DVT px post-surgery, found to have large, bilateral acute PE. She was started on IV heparin, pharmacy consulted to transition to apixaban. No bleeding noted, CBC is normal. Renal function is normal.  Plan:  Discontinue heparin drip Apixaban 10 mg PO bid for 7 days then 5 mg PO bid Education prior to discharge  Pharmacy signing off but will continue to follow peripherally    Loura Back, PharmD, BCPS Clinical Pharmacist Clinical phone for 01/29/2019 until 3p is [phone] 01/29/2019 12:36 PM  **Pharmacist phone directory can now be found on [email or web address] listed under [facility]**

## 2019-01-29 NOTE — Progress Notes (Addendum)
ANTICOAGULATION CONSULT NOTE - Initial Consult  Pharmacy Consult for Heparin Indication: pulmonary embolus  No Known Allergies  Vital Signs: Temp: 99.3 F (37.4 C) (02/03 1913) Temp Source: Oral (02/03 1913) BP: 127/78 (02/03 1913)  Labs: Recent Labs    01/29/19 0041  CREATININE 0.60    CrCl cannot be calculated (Unknown ideal weight.).   Medical History: Past Medical History:  Diagnosis Date  . Herpes   . Nexplanon insertion 12/2012   Right Arm  . No pertinent past medical history    Assessment: 23 y/o F with recent ORIF ankle, presents to the ED with shortness of breath, only on ASA for DVT px post-surgery, found to have large, bilateral acute PE. Hgb 12.9. Plts 274.   Goal of Therapy:  Heparin level 0.3-0.7 units/ml Monitor platelets by anticoagulation protocol: Yes   Plan:  Heparin 5000 units BOLUS Start heparin drip at 1200 units/hr 1030 HL Daily CBC/HL Monitor for bleeding   Abran Duke 01/29/2019,2:03 AM

## 2019-01-29 NOTE — Care Management (Signed)
#  8   S/W  CRAIG  @ [employer] RX # [phone]   APIXABAN : NONE FORMULARY  ELIQUIS  10 MG  BID : NONE FORMULARY  1. ELIQUIS  2.5 MG BID COVER- YES CO-PAY- $ 80.00 TIER- NO PRIOR APPROVAL- YES # [phone]  2. ELIQUIS  5 MG BID COVER- YES CO-PAY- $ 80.00 TIER- NO PRIOR APPROVAL- YES # [phone]  RIVAROXABAN : NONE FORMULARY  3. XARELTO 15 MG BID COVER- YES CO-PAY- $ 80.00 TIER- NO PRIOR APPROVAL- YES  # [phone]  4. XARELTO 20 MG DAILY COVER- YES CO-PAY- $ 80.00 TIER- NO PRIOR APPROVAL- YES #  [phone]  NO DEDUCTIBLE OUT-OF-POCKET: NOT MET  PREFERRED PHARMACY : YES [facility]

## 2019-01-29 NOTE — Care Management (Signed)
#  8   S/W  CRAIG  @ [employer] RX # [phone]   APIXABAN : NONE FORMULARY  ELIQUIS  10 MG  BID : NONE FORMULARY  1. ELIQUIS  2.5 MG BID COVER- YES CO-PAY- $ 80.00 TIER- NO PRIOR APPROVAL- YES # [phone]  2. ELIQUIS  5 MG BID COVER- YES CO-PAY- $ 80.00 TIER- NO PRIOR APPROVAL- YES # [phone]  RIVAROXABAN : NONE FORMULARY  3. XARELTO 15 MG BID COVER- YES CO-PAY- $ 80.00 TIER- NO PRIOR APPROVAL- YES  # [phone]  4. XARELTO 20 MG DAILY COVER- YES CO-PAY- $ 80.00 TIER- NO PRIOR APPROVAL- YES #  [phone]  NO DEDUCTIBLE OUT-OF-POCKET: NOT MET  PREFERRED PHARMACY : YES [facility]

## 2019-01-30 ENCOUNTER — Inpatient Hospital Stay (HOSPITAL_COMMUNITY): Payer: BLUE CROSS/BLUE SHIELD

## 2019-01-30 ENCOUNTER — Other Ambulatory Visit (HOSPITAL_COMMUNITY): Payer: BLUE CROSS/BLUE SHIELD

## 2019-01-30 DIAGNOSIS — I361 Nonrheumatic tricuspid (valve) insufficiency: Secondary | ICD-10-CM

## 2019-01-30 LAB — CBC
HCT: 40.1 % (ref 36.0–46.0)
Hemoglobin: 12.7 g/dL (ref 12.0–15.0)
MCH: 28.3 pg (ref 26.0–34.0)
MCHC: 31.7 g/dL (ref 30.0–36.0)
MCV: 89.5 fL (ref 80.0–100.0)
Platelets: 317 10*3/uL (ref 150–400)
RBC: 4.48 MIL/uL (ref 3.87–5.11)
RDW: 13.2 % (ref 11.5–15.5)
WBC: 14.8 10*3/uL — ABNORMAL HIGH (ref 4.0–10.5)
nRBC: 0 % (ref 0.0–0.2)

## 2019-01-30 LAB — HIV ANTIBODY (ROUTINE TESTING W REFLEX): HIV Screen 4th Generation wRfx: NONREACTIVE

## 2019-01-30 LAB — TROPONIN I: Troponin I: <0.03 ng/mL (ref <0.03)

## 2019-01-30 LAB — BASIC METABOLIC PANEL
Anion gap: 12 (ref 5–15)
BUN: 9 mg/dL (ref 6–20)
CO2: 21 mmol/L — ABNORMAL LOW (ref 22–32)
Calcium: 9.4 mg/dL (ref 8.9–10.3)
Chloride: 102 mmol/L (ref 98–111)
Creatinine, Ser: 0.81 mg/dL (ref 0.44–1.00)
GFR calc Af Amer: >60 mL/min (ref >60)
GFR calc non Af Amer: >60 mL/min (ref >60)
Glucose, Bld: 116 mg/dL — ABNORMAL HIGH (ref 70–99)
Potassium: 4.1 mmol/L (ref 3.5–5.1)
Sodium: 135 mmol/L (ref 135–145)

## 2019-01-30 LAB — ECHOCARDIOGRAM COMPLETE
Height: 64 in
Weight: 3696.67 oz

## 2019-01-30 MED ORDER — ELIQUIS 5 MG VTE STARTER PACK: ORAL_TABLET | ORAL | 0 refills | Status: DC

## 2019-01-30 MED ORDER — INFLUENZA VAC SPLIT QUAD 0.5 ML IM SUSY: 0.5000 mL | PREFILLED_SYRINGE | Freq: Once | INTRAMUSCULAR | 0 refills | Status: AC

## 2019-01-30 MED FILL — ELIQUIS STARTER PACK 5 MG T: 5 | 30 days supply | Qty: 74 | Fill #0

## 2019-01-30 NOTE — Discharge Instructions (Signed)
Information on my medicine - ELIQUIS (apixaban)  This medication education was reviewed with me or my healthcare representative as part of my discharge preparation.  The pharmacist that spoke with me during my hospital stay was:  Steffanie Rainwater, Student-PharmD  Why was Eliquis prescribed for you? Eliquis was prescribed to treat blood clots that may have been found in the veins of your lungs (pulmonary embolism) and to reduce the risk of them occurring again.  What do You need to know about Eliquis ? The starting dose is 10 mg (two 5 mg tablets) taken TWICE daily for the FIRST SEVEN (7) DAYS, then on 02/06/2019  the dose is reduced to ONE 5 mg tablet taken TWICE daily.  Eliquis may be taken with or without food.   Try to take the dose about the same time in the morning and in the evening. If you have difficulty swallowing the tablet whole please discuss with your pharmacist how to take the medication safely.  Take Eliquis exactly as prescribed and DO NOT stop taking Eliquis without talking to the doctor who prescribed the medication.  Stopping may increase your risk of developing a new blood clot.  Refill your prescription before you run out.  After discharge, you should have regular check-up appointments with your healthcare provider that is prescribing your Eliquis.    What do you do if you miss a dose? If a dose of ELIQUIS is not taken at the scheduled time, take it as soon as possible on the same day and twice-daily administration should be resumed. The dose should not be doubled to make up for a missed dose.  Important Safety Information A possible side effect of Eliquis is bleeding. You should call your healthcare provider right away if you experience any of the following: ? Bleeding from an injury or your nose that does not stop. ? Unusual colored urine (red or dark brown) or unusual colored stools (red or black). ? Unusual bruising for unknown reasons. ? A serious fall or  if you hit your head (even if there is no bleeding).  Some medicines may interact with Eliquis and might increase your risk of bleeding or clotting while on Eliquis. To help avoid this, consult your healthcare provider or pharmacist prior to using any new prescription or non-prescription medications, including herbals, vitamins, non-steroidal anti-inflammatory drugs (NSAIDs) and supplements.  This website has more information on Eliquis (apixaban): http://www.eliquis.com/eliquis/home

## 2019-01-30 NOTE — Progress Notes (Signed)
Patient s/p recent R ankle ORIF non-weightbearing on RLE, also with new onset back pain secondary to bilateral pulmonary embolisms, which impairs her ability to perform ambulation and activities of daily living in the home. A rolling walker will not resolve this issue. A wheelchair will allow the patient to safely perform daily activities. Patient will be able to self-propel in a lightweight wheelchair.  Emma Cantu, PT, DPT Acute Rehabilitation Services  Pager [phone] Office [phone]

## 2019-01-30 NOTE — Care Management Note (Addendum)
Case Management Note  Patient Details  Name: Emma Cantu MRN: [id number] Date of Birth: [birth year]  Subjective/Objective:   From home with parent, PE, will be on eliquis, NCM gave patient the 30 day free savings coupon and the 10.00 co pay card.  Per pt eval she will need HHPT, NCM offered choice from Medicare.gov list , she chose [facility].  Referral given to [facility] with [facility]. Soc will begin 24-48 hrs post dc.  Jermaine notified to bring wheelchair to patient's room prior to dc.                Action/Plan: DC home when ready.  Expected Discharge Date:  01/30/19               Expected Discharge Plan:  Home w Home Health Services  In-House Referral:     Discharge planning Services  CM Consult, Medication Assistance  Post Acute Care Choice:  Home Health Choice offered to:  Patient  DME Arranged:   Wheelchair manuel DME Agency:   [facility]  HH Arranged:  PT [facility] Agency:  [facility]  Status of Service:  Completed, signed off  If discussed at Long Length of Stay Meetings, dates discussed:    Additional Comments:  Leone Haven, RN 01/30/2019, 2:34 PM

## 2019-01-30 NOTE — Progress Notes (Signed)
SATURATION QUALIFICATIONS: (This note is used to comply with regulatory documentation for home oxygen)  Patient Saturations on Room Air at Rest = 97%  Patient Saturations on Room Air while using wheelchair= 93%

## 2019-01-30 NOTE — Progress Notes (Signed)
Physical Therapy Treatment Patient Details Name: Emma Cantu MRN: [id number] DOB: [birth year] Today's Date: 01/30/2019    History of Present Illness Pt is a 23 y.o. female s/p recent ankle ORIF (01/11/19) admitted 01/29/19 with SOB and back pain. Chest CTA showed bilateral PE with resultant R heart strain; questioned developing pulmonary infarct versus atelectasis. PMH includes allergic rhinitis.    PT Comments    Pt progressing towards physical therapy goals. Was able to perform bed mobility and SPT with min guard assist and RW for support.  Pt limited by pain which she describes as back spasms (on the R side of back only). Pt was instructed in breathing techniques to help with pain control. She was able to tolerate transition to recliner on 1L/min supplemental O2 and sats ranging from 88-92%. Will continue to follow. Anticipate pt will progress well once pain is better controlled.   Follow Up Recommendations  Home health PT;Supervision for mobility/OOB     Equipment Recommendations  None recommended by PT    Recommendations for Other Services       Precautions / Restrictions Precautions Precautions: Fall Required Braces or Orthoses: Other Brace Other Brace: R cam walker boot Restrictions Weight Bearing Restrictions: Yes RLE Weight Bearing: Non weight bearing Other Position/Activity Restrictions: Ankle fx s/p ORIF (01/11/19) - NWB per pt    Mobility  Bed Mobility Overal bed mobility: Needs Assistance Bed Mobility: Rolling;Sidelying to Sit Rolling: Supervision Sidelying to sit: Supervision;HOB elevated       General bed mobility comments: Tearful throughout. Pt moving slow 2 "spasms" in back but is able to transition to EOB without assistance.   Transfers Overall transfer level: Needs assistance   Transfers: Sit to/from Stand;Stand Pivot Transfers Sit to Stand: Min guard Stand pivot transfers: Min guard       General transfer comment: Pt demonstrated proper hand  placement on seated surface for safety. She was able to power-up to full stand with increased time. Once standing, pt having "spasms" in back that lasted ~1 minute before pt was able to initiate pivot transfer to recliner chair. Close, hands on guarding provided for safety however no assist required.   Ambulation/Gait             General Gait Details: Unable to progress to gait training at this time.    Stairs             Wheelchair Mobility    Modified Rankin (Stroke Patients Only)       Balance Overall balance assessment: Needs assistance Sitting-balance support: Feet supported;No upper extremity supported Sitting balance-Leahy Scale: Fair     Standing balance support: Bilateral upper extremity supported;During functional activity Standing balance-Leahy Scale: Poor Standing balance comment: Reliant on UE support for balance as she is NWB on RLE                            Cognition Arousal/Alertness: Awake/alert Behavior During Therapy: WFL for tasks assessed/performed Overall Cognitive Status: Within Functional Limits for tasks assessed                                        Exercises      General Comments        Pertinent Vitals/Pain Pain Assessment: Faces Faces Pain Scale: Hurts whole lot Pain Location: L-side chest and back Pain Descriptors / Indicators: Sharp;Crying;Grimacing Pain  Intervention(s): Monitored during session;Repositioned;Limited activity within patient's tolerance    Home Living                      Prior Function            PT Goals (current goals can now be found in the care plan section) Acute Rehab PT Goals Patient Stated Goal: Less pain  PT Goal Formulation: With patient Time For Goal Achievement: 02/12/19 Potential to Achieve Goals: Good Progress towards PT goals: Progressing toward goals    Frequency    Min 3X/week      PT Plan Current plan remains appropriate     Co-evaluation              AM-PAC PT "6 Clicks" Mobility   Outcome Measure  Help needed turning from your back to your side while in a flat bed without using bedrails?: A Little Help needed moving from lying on your back to sitting on the side of a flat bed without using bedrails?: A Little Help needed moving to and from a bed to a chair (including a wheelchair)?: A Little Help needed standing up from a chair using your arms (e.g., wheelchair or bedside chair)?: A Little Help needed to walk in hospital room?: A Lot Help needed climbing 3-5 steps with a railing? : Total 6 Click Score: 15    End of Session Equipment Utilized During Treatment: Gait belt Activity Tolerance: Patient limited by pain Patient left: in chair;with call bell/phone within reach Nurse Communication: Mobility status PT Visit Diagnosis: Other abnormalities of gait and mobility (R26.89)     Time: 9518-8416 PT Time Calculation (min) (ACUTE ONLY): 27 min  Charges:  $Gait Training: 23-37 mins                     Conni Slipper, PT, DPT Acute Rehabilitation Services Pager: [phone] Office: [phone]    Marylynn Pearson 01/30/2019, 9:38 AM

## 2019-01-30 NOTE — Progress Notes (Signed)
  Echocardiogram 2D Echocardiogram has been performed.  Delcie Roch 01/30/2019, 10:57 AM

## 2019-02-01 ENCOUNTER — Encounter: Payer: Self-pay | Admitting: Family Medicine

## 2019-02-01 ENCOUNTER — Other Ambulatory Visit: Payer: Self-pay

## 2019-02-01 ENCOUNTER — Ambulatory Visit (INDEPENDENT_AMBULATORY_CARE_PROVIDER_SITE_OTHER): Payer: BLUE CROSS/BLUE SHIELD | Admitting: Family Medicine

## 2019-02-01 DIAGNOSIS — I2729 Other secondary pulmonary hypertension: Secondary | ICD-10-CM | POA: Diagnosis not present

## 2019-02-01 DIAGNOSIS — I2699 Other pulmonary embolism without acute cor pulmonale: Secondary | ICD-10-CM | POA: Diagnosis not present

## 2019-02-01 DIAGNOSIS — K5909 Other constipation: Secondary | ICD-10-CM | POA: Diagnosis not present

## 2019-02-01 DIAGNOSIS — I2609 Other pulmonary embolism with acute cor pulmonale: Secondary | ICD-10-CM

## 2019-02-01 DIAGNOSIS — M546 Pain in thoracic spine: Secondary | ICD-10-CM | POA: Diagnosis not present

## 2019-02-01 DIAGNOSIS — Z7901 Long term (current) use of anticoagulants: Secondary | ICD-10-CM | POA: Diagnosis not present

## 2019-02-01 DIAGNOSIS — Z3009 Encounter for other general counseling and advice on contraception: Secondary | ICD-10-CM | POA: Diagnosis not present

## 2019-02-01 DIAGNOSIS — E669 Obesity, unspecified: Secondary | ICD-10-CM | POA: Diagnosis not present

## 2019-02-01 DIAGNOSIS — S82891D Other fracture of right lower leg, subsequent encounter for closed fracture with routine healing: Secondary | ICD-10-CM | POA: Diagnosis not present

## 2019-02-01 MED ORDER — POLYETHYLENE GLYCOL 3350 17 GM/SCOOP PO POWD: 17.0000 g | Freq: Two times a day (BID) | ORAL | 1 refills | Status: DC | PRN

## 2019-02-01 NOTE — Progress Notes (Signed)
Subjective:    Patient ID: Emma Cantu, female    DOB: 1996/05/31, 23 y.o.   MRN: [id number]   CC: Hospital follow-up for PE  HPI:  Recently hospitalized for PE: Patient reports that she has been compliant with her Eliquis dosing since she has been home.  She is currently still on the 2 5 mg tablets twice daily.  Since she is got home she has been short of breath but this has improved greatly.  Patient reports that she has had some palpitations but this is also improved from her recent hospitalization.  Patient worked with physical therapy today for the first time. ROS: Denies any chest pain, bruising, epistaxis  Contraception management: Patient would like to have her Nexplanon removed as this could have contributed to her recent PE.  She also states that since she started that she has had irregular periods and since starting the Eliquis her periods have become heavier.  Patient is interested in the IUD but states that she right now just wants the Nexplanon out and does not want any further contraception. ROS: Denies any dizziness, headaches, lightheadedness  Constipation: Patient reports that she has not had a regular bowel movement since she was discharged from the hospital.  Dates that she has not tried anything as she wanted to discuss this while she was at her appointment today.  Patient denies any blood in her stool or dark stools.  Patient denies any nausea or vomiting.  Denies any abdominal pain.  Smoking status reviewed  ROS: 10 point ROS is otherwise negative, except as mentioned in HPI  Patient Active Problem List   Diagnosis Date Noted  . Constipation 02/05/2019  . Pulmonary emboli (HCC) 01/29/2019  . Increased frequency of urination 12/22/2017  . Genital herpes 12/11/2017  . Screen for STD (sexually transmitted disease) 05/23/2017  . Eczema 09/23/2016  . Contraception management 01/27/2013  . ALLERGIC RHINITIS 10/05/2010     Objective:  BP 92/62   Pulse 99    Temp 98.6 F (37 C) (Oral)   SpO2 96%  Vitals and nursing note reviewed  General: NAD, pleasant, in wheelchair Cardiac: RRR, normal heart sounds, no murmurs Respiratory: CTAB, normal effort Abdomen: soft, nontender, nondistended, bowel sounds normoactive x4 Extremities: no edema or cyanosis. WWP.  Right leg in a cast Skin: warm and dry, no rashes noted Neuro: alert and oriented, no focal deficits Psych: normal affect  Assessment & Plan:    Contraception management Patient has an appointment scheduled with PCP and would like to have Nexplanon removed at that appointment.  Will discuss IUD as an option as she does not like her irregular periods.  Pulmonary emboli [facility]) Patient here for follow-up for recent hospitalizations with PE.  Likely due to Nexplanon as well as recent immobilization from break in her right leg.  Patient has been compliant with her Eliquis two 5 mg tabs twice daily and will be on this for a few more days before switching to the 1 5 mg tablet twice daily.   Patient reports that her symptoms have improved since leaving hospital and she has started working with physical therapy.  Constipation Patient recently hospitalized and immobilized due to break in her leg as well as PE which is likely contributing to her constipation.  Denies any blood in stool, dark stools or abdominal pain.  Patient has not tried anything at home yet but will start MiraLAX that was prescribed to her in the office today.  Given return precautions.  [place] Ygnacio Fecteau, DO Family Medicine Resident PGY-2

## 2019-02-01 NOTE — Patient Instructions (Signed)
Thank you for coming to see me today. It was a pleasure! Today we talked about:   Please take Eliquis 10 mg twice daily until 2/10, then Eliquis 5 mg twice daily starting 2/11 for a total 3 months.  I have sent miralax to your pharmacy to use for constipation.   Please continue physical therapy and let us know if you have any questions. Please do not hesitate to come back here or to the emergency room if you have dizziness, lightheadedness or develop severe abdominal pain.   If you have any questions or concerns, please do not hesitate to call the office at [phone].  Take Care,   [place] Saraya Tirey, DO

## 2019-02-04 ENCOUNTER — Telehealth: Payer: Self-pay | Admitting: *Deleted

## 2019-02-04 NOTE — Telephone Encounter (Signed)
Jeneanne from [facility] calling for PT verbal orders as follows:  1 time(s) weekly for 4 week(s)  You can leave verbal orders on confidential voicemail.  Fleeger, Maryjo [place], CMA

## 2019-02-04 NOTE — Telephone Encounter (Signed)
VO placed.  David McMullen, DO [facility], PGY-3  

## 2019-02-05 DIAGNOSIS — K5900 Constipation, unspecified: Secondary | ICD-10-CM | POA: Insufficient documentation

## 2019-02-05 DIAGNOSIS — I2729 Other secondary pulmonary hypertension: Secondary | ICD-10-CM | POA: Diagnosis not present

## 2019-02-05 DIAGNOSIS — S82891D Other fracture of right lower leg, subsequent encounter for closed fracture with routine healing: Secondary | ICD-10-CM | POA: Diagnosis not present

## 2019-02-05 DIAGNOSIS — E669 Obesity, unspecified: Secondary | ICD-10-CM | POA: Diagnosis not present

## 2019-02-05 DIAGNOSIS — I2699 Other pulmonary embolism without acute cor pulmonale: Secondary | ICD-10-CM | POA: Diagnosis not present

## 2019-02-05 DIAGNOSIS — M546 Pain in thoracic spine: Secondary | ICD-10-CM | POA: Diagnosis not present

## 2019-02-05 DIAGNOSIS — Z7901 Long term (current) use of anticoagulants: Secondary | ICD-10-CM | POA: Diagnosis not present

## 2019-02-05 NOTE — Assessment & Plan Note (Signed)
Patient has an appointment scheduled with PCP and would like to have Nexplanon removed at that appointment.  Will discuss IUD as an option as she does not like her irregular periods.

## 2019-02-05 NOTE — Assessment & Plan Note (Signed)
Patient here for follow-up for recent hospitalizations with PE.  Likely due to Nexplanon as well as recent immobilization from break in her right leg.  Patient has been compliant with her Eliquis two 5 mg tabs twice daily and will be on this for a few more days before switching to the 1 5 mg tablet twice daily.   Patient reports that her symptoms have improved since leaving hospital and she has started working with physical therapy.

## 2019-02-05 NOTE — Assessment & Plan Note (Signed)
Patient recently hospitalized and immobilized due to break in her leg as well as PE which is likely contributing to her constipation.  Denies any blood in stool, dark stools or abdominal pain.  Patient has not tried anything at home yet but will start MiraLAX that was prescribed to her in the office today.  Given return precautions.

## 2019-02-08 ENCOUNTER — Other Ambulatory Visit: Payer: Self-pay

## 2019-02-08 ENCOUNTER — Ambulatory Visit (INDEPENDENT_AMBULATORY_CARE_PROVIDER_SITE_OTHER): Payer: BLUE CROSS/BLUE SHIELD | Admitting: Family Medicine

## 2019-02-08 VITALS — BP 110/60 | HR 88

## 2019-02-08 DIAGNOSIS — G44219 Episodic tension-type headache, not intractable: Secondary | ICD-10-CM | POA: Diagnosis not present

## 2019-02-11 DIAGNOSIS — G44219 Episodic tension-type headache, not intractable: Secondary | ICD-10-CM | POA: Insufficient documentation

## 2019-02-11 NOTE — Progress Notes (Signed)
    Subjective:  Emma Cantu is a 23 y.o. female who presents to the [facility] today with a chief complaint of headaches.   HPI: Patient with history of the past few months significant for open reduction of a fractured lower extremity and an acute PE thought to be provoked by immobility and potentially hormonal birth control presents for mild recurring headaches over the past 4 days.  She has had no visual changes no loss consciousness no vertigo symptoms no changes in her sensation or motor control.  No changes in speech, facial drooping.  All headaches have been approximately 20 minutes and seemed to self resolve with Tylenol and over-the-counter medication with no lasting effects.  They have not been changing in severity or frequency.  She has been stressed out due to all of her recent medical problems and because she is now on an anticoagulant just wants to make sure that she is not in danger.  She denies any falls or recent head injuries   Objective:  Physical Exam: BP 110/60   Pulse 88   SpO2 96%   Gen: NAD, resting comfortably CV: RRR with no murmurs appreciated Pulm: NWOB, CTAB with no crackles, wheezes, or rhonchi MSK: no edema, cyanosis, or clubbing noted Skin: warm, dry Neuro: grossly normal, moves all extremities.  Cranial nerve exam normal without any deficits noted.  She has no current neuro complaints at this visit Psych: Normal affect and thought content  No results found for this or any previous visit (from the past 72 hour(s)).   Assessment/Plan:  Episodic tension-type headache, not intractable Mild over the last 4 days, intermittent, resolved with Tylenol.  No neurological sequela and no lasting deficits.  Return precautions discussed patient is on anticoagulant after recent PE and ankle surgery.  In regards to potential impact of hormonal birth control on her PE she is seeing her PCP to potentially have this changed over to an IUD.   Marthenia Rolling, DO FAMILY MEDICINE  RESIDENT - PGY2 02/11/2019 1:02 PM

## 2019-02-11 NOTE — Assessment & Plan Note (Signed)
Mild over the last 4 days, intermittent, resolved with Tylenol.  No neurological sequela and no lasting deficits.  Return precautions discussed patient is on anticoagulant after recent PE and ankle surgery.  In regards to potential impact of hormonal birth control on her PE she is seeing her PCP to potentially have this changed over to an IUD.

## 2019-02-11 NOTE — Patient Instructions (Signed)
Patient declined AVS 

## 2019-02-12 ENCOUNTER — Encounter: Payer: Self-pay | Admitting: Family Medicine

## 2019-02-13 ENCOUNTER — Telehealth: Payer: Self-pay | Admitting: Family Medicine

## 2019-02-13 NOTE — Telephone Encounter (Signed)
Patient regarding complaints of fullness sensation in throat and stuck on feeling over the last week.  She was recently hospitalized a few weeks ago for a pulmonary embolism and is currently on Eliquis.  She denies any lip or throat swelling, shortness of breath, chest pain.  She tried some hot chocolate and Tylenol with some relief.  Discussed that this may be acid reflux and advised conservative management along with over-the-counter Pepcid as needed.  Patient has appointment scheduled on 02/20/2019.  Reviewed return precautions.  Patient in agreement with plan without further questions or concerns.  Durward Parcel, DO [facility], PGY-3

## 2019-02-14 DIAGNOSIS — E669 Obesity, unspecified: Secondary | ICD-10-CM | POA: Diagnosis not present

## 2019-02-14 DIAGNOSIS — S82891D Other fracture of right lower leg, subsequent encounter for closed fracture with routine healing: Secondary | ICD-10-CM | POA: Diagnosis not present

## 2019-02-14 DIAGNOSIS — M546 Pain in thoracic spine: Secondary | ICD-10-CM | POA: Diagnosis not present

## 2019-02-14 DIAGNOSIS — Z7901 Long term (current) use of anticoagulants: Secondary | ICD-10-CM | POA: Diagnosis not present

## 2019-02-14 DIAGNOSIS — I2699 Other pulmonary embolism without acute cor pulmonale: Secondary | ICD-10-CM | POA: Diagnosis not present

## 2019-02-14 DIAGNOSIS — I2729 Other secondary pulmonary hypertension: Secondary | ICD-10-CM | POA: Diagnosis not present

## 2019-02-20 ENCOUNTER — Ambulatory Visit (INDEPENDENT_AMBULATORY_CARE_PROVIDER_SITE_OTHER): Payer: BLUE CROSS/BLUE SHIELD | Admitting: Family Medicine

## 2019-02-20 ENCOUNTER — Ambulatory Visit: Payer: BLUE CROSS/BLUE SHIELD | Admitting: Family Medicine

## 2019-02-20 VITALS — BP 122/80 | HR 104 | Temp 98.5°F

## 2019-02-20 DIAGNOSIS — Z3046 Encounter for surveillance of implantable subdermal contraceptive: Secondary | ICD-10-CM

## 2019-02-20 NOTE — Assessment & Plan Note (Signed)
Removal for history of VTE.  Successful removal without complication.  Patient tolerated procedure well.  See procedure note. - Discussed risk for pregnancy and need for secondary protection in the interim - Patient to follow-up with contraceptive plan - Reviewed return precautions, RTC as needed

## 2019-02-20 NOTE — Patient Instructions (Signed)
Thank you for coming in to see [place] today. Please see below to review our plan for today's visit.  Let us know if you have persistent bleeding, swelling or redness at the site.  Please call the clinic at [phone] if your symptoms worsen or you have any concerns. It was our pleasure to serve you.  Durward Parcel, DO [facility], PGY-3

## 2019-02-20 NOTE — Progress Notes (Signed)
   Subjective   Patient ID: Emma Cantu    DOB: 10-21-1996, 23 y.o. female   MRN: [id number]  CC: "Nexplanon removal"  HPI: Emma Cantu is a 23 y.o. female who presents to clinic today for the following:  Contraception with history of PE: Emma Cantu was recently admitted to the hospital for shortness of breath following prolonged immobilization due to recent ankle surgery back on 01/11/2019.  She was found to have bilateral acute pulmonary emboli with right heart strain on CT.  She was anticoagulated and discharged on Eliquis.  Echo did not reveal heart strain on exam.  She was found to have an incidental mild pulmonary hypertension.  She has since followed up here at the clinic and would like her Nexplanon removed as this was thought to be contributing to her PE.  ROS: see HPI for pertinent.  PMFSH: Reviewed. Smoking status reviewed. Medications reviewed.  Objective   BP 122/80   Pulse (!) 104   Temp 98.5 F (36.9 C) (Oral)   SpO2 97%  Vitals and nursing note reviewed.  General: well nourished, well developed, NAD with non-toxic appearance HEENT: normocephalic, atraumatic, moist mucous membranes Neck: supple, non-tender without lymphadenopathy Lungs: normal work of breathing Skin: warm, dry, no rashes or lesions Extremities: warm and well perfused, normal tone, no edema, palpable Nexplanon on right upper extremity with no overlying erythema  Procedure Note: Nexplanon Removal Patient given informed consent and signed copy in the chart. Left arm area prepped and draped in the usual sterile fashion. Three cc of lidocaine with epinephrine 1% used for local anesthesia. A small stab incision was made close to the nexplanon with scalpel. Hemostats were used to withdraw the nexplanon. A small bandage was applied over a steri strip  No complications.Patient given follow up instructions should she experience redness, swelling at sight or fever in the next 24 hours. Patient was reminded this  totally removes her nexplanon contraceptive devise. (she can now potentially conceive)  Assessment & Plan   Nexplanon removal Removal for history of VTE.  Successful removal without complication.  Patient tolerated procedure well.  See procedure note. - Discussed risk for pregnancy and need for secondary protection in the interim - Patient to follow-up with contraceptive plan - Reviewed return precautions, RTC as needed  No orders of the defined types were placed in this encounter.  No orders of the defined types were placed in this encounter.   Durward Parcel, DO [facility], PGY-3 02/20/2019, 12:12 PM

## 2019-02-21 ENCOUNTER — Encounter: Payer: Self-pay | Admitting: Family Medicine

## 2019-02-22 DIAGNOSIS — S82851D Displaced trimalleolar fracture of right lower leg, subsequent encounter for closed fracture with routine healing: Secondary | ICD-10-CM | POA: Diagnosis not present

## 2019-02-25 ENCOUNTER — Other Ambulatory Visit: Payer: Self-pay | Admitting: Family Medicine

## 2019-02-25 DIAGNOSIS — I2609 Other pulmonary embolism with acute cor pulmonale: Secondary | ICD-10-CM

## 2019-02-25 MED ORDER — APIXABAN 5 MG PO TABS: 5.0000 mg | ORAL_TABLET | Freq: Two times a day (BID) | ORAL | 1 refills | Status: DC

## 2019-02-26 ENCOUNTER — Other Ambulatory Visit: Payer: Self-pay | Admitting: Family Medicine

## 2019-02-26 ENCOUNTER — Telehealth: Payer: Self-pay | Admitting: *Deleted

## 2019-02-26 DIAGNOSIS — R262 Difficulty in walking, not elsewhere classified: Secondary | ICD-10-CM | POA: Diagnosis not present

## 2019-02-26 DIAGNOSIS — M6281 Muscle weakness (generalized): Secondary | ICD-10-CM | POA: Diagnosis not present

## 2019-02-26 DIAGNOSIS — M25571 Pain in right ankle and joints of right foot: Secondary | ICD-10-CM | POA: Diagnosis not present

## 2019-02-26 DIAGNOSIS — M25671 Stiffness of right ankle, not elsewhere classified: Secondary | ICD-10-CM | POA: Diagnosis not present

## 2019-02-26 DIAGNOSIS — I2609 Other pulmonary embolism with acute cor pulmonale: Secondary | ICD-10-CM

## 2019-02-26 NOTE — Telephone Encounter (Signed)
Received message from Dr. Abelardo Diesel that PA was needed on eliquis. Started in cover my meds. Approved 01/27/2019 - 02/26/2020  Pharmacy informed. Brendalyn Vallely, Maryjo [place], CMA  Cover my meds ID: [id number]

## 2019-03-01 DIAGNOSIS — R262 Difficulty in walking, not elsewhere classified: Secondary | ICD-10-CM | POA: Diagnosis not present

## 2019-03-01 DIAGNOSIS — M25571 Pain in right ankle and joints of right foot: Secondary | ICD-10-CM | POA: Diagnosis not present

## 2019-03-01 DIAGNOSIS — M6281 Muscle weakness (generalized): Secondary | ICD-10-CM | POA: Diagnosis not present

## 2019-03-01 DIAGNOSIS — M25671 Stiffness of right ankle, not elsewhere classified: Secondary | ICD-10-CM | POA: Diagnosis not present

## 2019-03-27 DIAGNOSIS — M25571 Pain in right ankle and joints of right foot: Secondary | ICD-10-CM | POA: Diagnosis not present

## 2019-04-01 ENCOUNTER — Encounter: Payer: Self-pay | Admitting: Family Medicine

## 2019-04-24 DIAGNOSIS — M25571 Pain in right ankle and joints of right foot: Secondary | ICD-10-CM | POA: Diagnosis not present

## 2019-05-29 DIAGNOSIS — M25571 Pain in right ankle and joints of right foot: Secondary | ICD-10-CM | POA: Diagnosis not present

## 2019-05-30 ENCOUNTER — Ambulatory Visit (INDEPENDENT_AMBULATORY_CARE_PROVIDER_SITE_OTHER): Payer: Medicaid Other | Admitting: Student in an Organized Health Care Education/Training Program

## 2019-05-30 ENCOUNTER — Other Ambulatory Visit: Payer: Self-pay

## 2019-05-30 VITALS — BP 108/74 | HR 78

## 2019-05-30 DIAGNOSIS — Z113 Encounter for screening for infections with a predominantly sexual mode of transmission: Secondary | ICD-10-CM

## 2019-05-30 DIAGNOSIS — R350 Frequency of micturition: Secondary | ICD-10-CM | POA: Diagnosis not present

## 2019-05-30 DIAGNOSIS — R599 Enlarged lymph nodes, unspecified: Secondary | ICD-10-CM | POA: Diagnosis not present

## 2019-05-30 LAB — POCT URINALYSIS DIP (MANUAL ENTRY)
Bilirubin, UA: NEGATIVE
Glucose, UA: NEGATIVE mg/dL
Ketones, POC UA: NEGATIVE mg/dL
Leukocytes, UA: NEGATIVE
Nitrite, UA: NEGATIVE
Protein Ur, POC: NEGATIVE mg/dL
Spec Grav, UA: 1.02 (ref 1.010–1.025)
Urobilinogen, UA: 0.2 E.U./dL
pH, UA: 8.5 — AB (ref 5.0–8.0)

## 2019-05-30 LAB — POCT UA - MICROSCOPIC ONLY

## 2019-05-30 LAB — POCT WET PREP (WET MOUNT)
Clue Cells Wet Prep Whiff POC: POSITIVE
Trichomonas Wet Prep HPF POC: ABSENT

## 2019-05-30 LAB — POCT URINE PREGNANCY: Preg Test, Ur: NEGATIVE

## 2019-05-30 NOTE — Patient Instructions (Addendum)
It was a pleasure seeing you today in our clinic.  Here is the treatment plan we have discussed and agreed upon together:  We drew blood work at today's visit. I will call or send you a letter with these results. If you do not hear from me within the next week, please give our office a call.  Please return to give a urine sample tomorrow so we may test for further STI's. You can make this visit when you leave the office today.  Our clinic's number is [phone]. Please call with questions or concerns about what we discussed today.  Be well, Dr. Mosetta Putt

## 2019-05-30 NOTE — Progress Notes (Signed)
Subjective:    Emma Cantu - 23 y.o. female MRN [id number]  Date of birth: 08/11/1996  HPI  Emma Cantu is here for bump on her neck and urinary frequency. PMH includes allergic rhinitis, eczema, genital herpes, and hx of pulmonary emboli on eliquis.  Bump on neck  She was scratching her neck yesterday when she noticed a bump behind her left ear. No pain. No recent fevers. 1 day of sore throat 1-2 weeks ago. She does have seasonal allergies. No ear pain. No rhinorrhea or congestion currently. She had an enlarged lymph node in her groin once associated with UTI per her report.   Increased urinary frequency Noted for the past 1-2 weeks. One episode of burning with urination two days ago. No dysuria today. No hematuria. No vaginal symptoms or discharge. No abdominal pain, diarrhea or constipation. She feels that she needs to urinate at least once every 30 minutes and this is bothersome to her. She has had UTI's in the past and is unsure if this is similar to previous episodes.  Concern for STI Patient endorses unprotected sex with one female partner 6 days ago. She is concerned about possible STI. She is not currently using contraception. She does have a hx of genital herpes. No pelvic pain or lesions.    Health Maintenance:  -  Health Maintenance Due  Topic Date Due  . PAP SMEAR-Modifier  06/10/2019    -  reports that she has never smoked. She has never used smokeless tobacco. - Review of Systems: Per HPI. - Past Medical History: Patient Active Problem List   Diagnosis Date Noted  . Enlarged lymph node 05/30/2019  . Constipation 02/05/2019  . Pulmonary emboli (HCC) 01/29/2019  . Urinary frequency 12/22/2017  . Genital herpes 12/11/2017  . Routine screening for STI (sexually transmitted infection) 05/23/2017  . Eczema 09/23/2016  . ALLERGIC RHINITIS 10/05/2010   - Medications: reviewed and updated Current Outpatient Medications  Medication Sig Dispense Refill  . ELIQUIS  5 MG TABS tablet Take 1 tablet by mouth twice daily 60 tablet 0  . ELIQUIS STARTER PACK (ELIQUIS STARTER PACK) 5 MG TABS Take as directed on package: start with two-5mg  tablets twice daily for 7 days. On day 8, switch to one-5mg  tablet twice daily. 1 each 0  . etonogestrel (NEXPLANON) 68 MG IMPL implant 1 each by Subdermal route once.    . methocarbamol (ROBAXIN) 500 MG tablet Take 1 tablet (500 mg total) by mouth every 8 (eight) hours as needed for muscle spasms. 40 tablet 0  . polyethylene glycol powder (GLYCOLAX/MIRALAX) powder Take 17 g by mouth 2 (two) times daily as needed. 3350 g 1  . valACYclovir (VALTREX) 500 MG tablet Take 1 tablet (500 mg total) by mouth daily. 90 tablet 2   No current facility-administered medications for this visit.     Review of Systems See HPI     Objective:   Physical Exam BP 108/74   Pulse 78   LMP 05/25/2019 (Exact Date)   SpO2 98%  Gen: NAD, alert, cooperative with exam, well-appearing  Neck: 0.5 cm deep palpable lymph node is non-tender. No surrounding erythema or induration. No  HEENT: NCAT, PERRL, clear conjunctiva, oropharynx clear, supple neck CV: RRR, good S1/S2, no murmur, no edema, capillary refill brisk  Resp: CTABL, no wheezes, non-labored Abd: SNTND, BS present, no guarding or organomegaly Skin: no rashes, normal turgor  Neuro: no gross deficits.  Psych: good insight, alert and oriented  Assessment & Plan:   Enlarged lymph node Small, likely reactive LAD with seasonal allergies. 0.5 cm in diameter. No B symptoms. - monitor - return precautions advised   Routine screening for STI (sexually transmitted infection) Patient elects to self swab for wet prep to test for trichonomas. HIV and RPR sent. She will return to lab tomorrow to give urine sample for GC/chlamydia. Pregnancy test negative. Cup overfilled, cannot use this urine to test GC/chlamydia.  Urinary frequency No evidence of infection on UA. No hematuria or dysuria.  There may be an anxiety component. STI workup as below. Patient should limit water 30-60 minutes before bed.    Orders Placed This Encounter  Procedures  . HIV antibody (with reflex)  . RPR  . POCT urinalysis dipstick  . POCT UA - Microscopic Only  . POCT urine pregnancy  . POCT Wet Prep [facility])    No orders of the defined types were placed in this encounter.   Howard Pouch, MD,MS,  PGY3 05/31/2019 12:07 PM

## 2019-05-30 NOTE — Assessment & Plan Note (Signed)
Small, likely reactive LAD with seasonal allergies. 0.5 cm in diameter. No B symptoms. - monitor - return precautions advised

## 2019-05-31 ENCOUNTER — Other Ambulatory Visit (HOSPITAL_COMMUNITY)
Admission: RE | Admit: 2019-05-31 | Discharge: 2019-05-31 | Disposition: A | Discharge status: Home / Self Care | Payer: Medicaid Other | Source: Ambulatory Visit | Attending: Family Medicine | Admitting: Family Medicine

## 2019-05-31 ENCOUNTER — Other Ambulatory Visit: Payer: Self-pay

## 2019-05-31 ENCOUNTER — Other Ambulatory Visit: Payer: Medicaid Other

## 2019-05-31 DIAGNOSIS — Z113 Encounter for screening for infections with a predominantly sexual mode of transmission: Secondary | ICD-10-CM

## 2019-05-31 LAB — HIV ANTIBODY (ROUTINE TESTING W REFLEX): HIV Screen 4th Generation wRfx: NONREACTIVE

## 2019-05-31 LAB — RPR: RPR Ser Ql: NONREACTIVE

## 2019-05-31 NOTE — Assessment & Plan Note (Signed)
No evidence of infection on UA. No hematuria or dysuria. There may be an anxiety component. STI workup as below. Patient should limit water 30-60 minutes before bed.

## 2019-05-31 NOTE — Assessment & Plan Note (Signed)
Patient elects to self swab for wet prep to test for trichonomas. HIV and RPR sent. She will return to lab tomorrow to give urine sample for GC/chlamydia. Pregnancy test negative. Cup overfilled, cannot use this urine to test GC/chlamydia.

## 2019-06-03 LAB — URINE CYTOLOGY ANCILLARY ONLY
Chlamydia: NEGATIVE
Neisseria Gonorrhea: NEGATIVE

## 2019-06-05 ENCOUNTER — Telehealth: Payer: Self-pay | Admitting: Family Medicine

## 2019-06-05 ENCOUNTER — Encounter: Payer: Self-pay | Admitting: Student in an Organized Health Care Education/Training Program

## 2019-06-05 NOTE — Telephone Encounter (Signed)
Pt is calling and said that she received the results from her urine test but not from her blood and hiv testing.   Pt would like for someone to call her to discuss results when they are available.

## 2019-06-07 NOTE — Telephone Encounter (Signed)
Please let the patient know that all of her results were normal. HIV and syphilis were negative.

## 2019-06-10 NOTE — Telephone Encounter (Signed)
Pt informed. Emma Cantu T Byard Carranza, CMA  

## 2019-06-27 DIAGNOSIS — J309 Allergic rhinitis, unspecified: Secondary | ICD-10-CM | POA: Diagnosis not present

## 2019-06-27 DIAGNOSIS — L2084 Intrinsic (allergic) eczema: Secondary | ICD-10-CM | POA: Diagnosis not present

## 2019-06-27 DIAGNOSIS — Z86711 Personal history of pulmonary embolism: Secondary | ICD-10-CM | POA: Diagnosis not present

## 2019-06-27 DIAGNOSIS — A6000 Herpesviral infection of urogenital system, unspecified: Secondary | ICD-10-CM | POA: Diagnosis not present

## 2019-06-27 DIAGNOSIS — Z7901 Long term (current) use of anticoagulants: Secondary | ICD-10-CM | POA: Diagnosis not present

## 2019-07-09 ENCOUNTER — Encounter: Payer: Self-pay | Admitting: Family Medicine

## 2019-07-09 ENCOUNTER — Ambulatory Visit: Payer: Medicaid Other | Admitting: Family Medicine

## 2019-07-09 ENCOUNTER — Other Ambulatory Visit: Payer: Self-pay

## 2019-07-09 VITALS — BP 111/70 | HR 90 | Temp 99.0°F | Wt 220.0 lb

## 2019-07-09 DIAGNOSIS — F418 Other specified anxiety disorders: Secondary | ICD-10-CM

## 2019-07-09 DIAGNOSIS — R4589 Other symptoms and signs involving emotional state: Secondary | ICD-10-CM | POA: Insufficient documentation

## 2019-07-09 DIAGNOSIS — R350 Frequency of micturition: Secondary | ICD-10-CM | POA: Diagnosis not present

## 2019-07-09 DIAGNOSIS — F321 Major depressive disorder, single episode, moderate: Secondary | ICD-10-CM

## 2019-07-09 DIAGNOSIS — R7309 Other abnormal glucose: Secondary | ICD-10-CM

## 2019-07-09 LAB — POCT GLYCOSYLATED HEMOGLOBIN (HGB A1C): Hemoglobin A1C: 5.4 % (ref 4.0–5.6)

## 2019-07-09 LAB — POCT URINALYSIS DIP (MANUAL ENTRY)
Bilirubin, UA: NEGATIVE
Blood, UA: NEGATIVE
Glucose, UA: NEGATIVE mg/dL
Nitrite, UA: NEGATIVE
Protein Ur, POC: NEGATIVE mg/dL
Spec Grav, UA: 1.025 (ref 1.010–1.025)
Urobilinogen, UA: 1 E.U./dL
pH, UA: 6 (ref 5.0–8.0)

## 2019-07-09 LAB — POCT URINE PREGNANCY: Preg Test, Ur: NEGATIVE

## 2019-07-09 NOTE — Assessment & Plan Note (Addendum)
Work up thus far unremarkable. History of mildly elevated blood sugars however A1C 5.4. UA without signs of infection, pregnancy test negative. Work up for STDs in June negative. Patient very tearful during exam, especially when discussing her anxiety. Likely contributing to her concerns. Caffeine intake may be contributing as well. Discussed this with patient. Will focus on addressing anxiety/depression at this time and consider further work up if no improvement or worsening. Patient open to this plan. See anxiety/depression plan below.

## 2019-07-09 NOTE — Patient Instructions (Signed)
Thank you so much for coming to see me today. We have done some labs to evaluate your increased urination. I will call you if any results are positive. I have also sent in a referral to our behavioral health coordinator, Casimer Lanius. Please expect a call from her.   Please return as needed for further care or concerns.  Mina Marble, DO [facility]

## 2019-07-09 NOTE — Assessment & Plan Note (Signed)
See plan above.

## 2019-07-09 NOTE — Assessment & Plan Note (Signed)
Patient endorses increased anxiety about her health since her broken ankle and pulmonary emboli. She endorses focusing on small symptoms and becoming very anxious about them. She is very tearful throughout exam when discussing her anxiety/depression. PGH-9 =15 and GAD-7 = 15. No psychiatric history. Denies SI/HI. She is reluctant to start medication at this time but is open to counseling. Referral placed to [facility] coordinator Casimer Lanius for further evaluation. Can consider referral to psychiatrist vs SSRI vs continuing counseling pending visit with Casimer Lanius. Will follow up after visit with [facility]. Patient agrees to plan.

## 2019-07-09 NOTE — Progress Notes (Signed)
Subjective:   Patient ID: Emma Cantu    DOB: 07/15/1996, 23 y.o. female   MRN: [id number]  Emma PlaneRonee A Bruster is a 23 y.o. female here for urinary frequency.  Increased Urinary frequency: Patient here today for increased urinary frequency x 2 months. Notes she feels like she is going 6x/day and 2-3x/night. She was seen in June 2020 for similar complaints with negative UA, pregnancy test, and STI's. She was positive for BV at that time. Her urinalysis today is negative for glucose and only trace leuks. She notes the frequent urination isnt as bad as it was but it is happening. Denies any dysuria, hematuria. Denis any abnormal vaginal discharge. Sexually active with one partner, not on birth control but uses condoms regularly. Notes drinks 2 bottles of water, 2 bottles of soda, and 2 bottles of green tea. Denies any diarrhea, constipation.  Some concern at last visit for anxiety related to these symptoms. Patient does endorse increased anxiety since she hurt her ankle, particularly about her health. PHQ-9 score 15. GAD-7 score 15. Patient denies any history of anxiety or depression. Was noted by a physician to have some depression after the birth of her child (6 years ago) but opted not to start anything at that time. Current medications include Valacyclovir and Women's multivitamin.    Review of Systems:  Per HPI.   PMFSH, medications and smoking status reviewed.  Objective:   BP 111/70   Pulse 90   Temp 99 F (37.2 C) (Oral)   Wt 220 lb (99.8 kg)   LMP 06/25/2019   SpO2 98%   BMI 37.76 kg/m  Vitals and nursing note reviewed.  General: well nourished, well developed, in no acute distress with non-toxic appearance, sitting comfortably in exam chair Neck: supple, normal ROM CV: regular rate and rhythm without murmurs, rubs, or gallops, no lower extremity edema Lungs: clear to auscultation bilaterally with normal work of breathing Abdomen: soft, non-tender, non-distended, normoactive  bowel sounds Skin: warm, dry Extremities: warm and well perfused Psych: Cognition and judgment appear intact. Alert, communicative  and cooperative with normal attention span and concentration. No apparent delusions, illusions, hallucinations.   Assessment & Plan:   Urinary frequency Work up thus far unremarkable. History of mildly elevated blood sugars however A1C 5.4. UA without signs of infection, pregnancy test negative. Work up for STDs in June negative. Patient very tearful during exam, especially when discussing her anxiety. Likely contributing to her concerns. Caffeine intake may be contributing as well. Discussed this with patient. Will focus on addressing anxiety/depression at this time and consider further work up if no improvement or worsening. Patient open to this plan. See anxiety/depression plan below.  Anxiety about health Patient endorses increased anxiety about her health since her broken ankle and pulmonary emboli. She endorses focusing on small symptoms and becoming very anxious about them. She is very tearful throughout exam when discussing her anxiety/depression. PGH-9 =15 and GAD-7 = 15. No psychiatric history. Denies SI/HI. She is reluctant to start medication at this time but is open to counseling. [facility] Hineseborah Moore for further evaluation. Can consider referral to psychiatrist vs SSRI vs continuing counseling pending visit with Sammuel Hineseborah Moore. [facility]. Patient agrees to plan.  Depression, major, single episode, moderate (HCC) See plan above  Orders Placed This Encounter  Procedures  . POCT urinalysis dipstick  . POCT urine pregnancy  . HgB A1c    Orpah CobbKiersten Jasminne Mealy, DO PGY-1, [facility] 07/09/2019 10:21 PM

## 2019-07-11 ENCOUNTER — Telehealth: Payer: Self-pay | Admitting: Licensed Clinical Social Worker

## 2019-07-11 NOTE — Telephone Encounter (Signed)
  07/11/2019 Name: Emma Cantu MRN: [id number] DOB: [birth year]  Referred by: Danna Hefty, DO Reason for referral : Care Coordination (for mental health )  Emma Cantu is a 23 y.o. year old female who sees Anton, [place], DO for primary care.  Referred by PCP for counseling, assessment and recommendation.   LCSW introduced self, explained [place].  Patient is interested in setting up a phone appointment with LCSW.  Follow Up Plan: phone appointment scheduled July 24th at 2:00  [place], [place]   [phone] 9:54 AM

## 2019-07-19 ENCOUNTER — Institutional Professional Consult (permissible substitution): Payer: Medicaid Other

## 2019-07-19 ENCOUNTER — Telehealth: Payer: Self-pay | Admitting: Licensed Clinical Social Worker

## 2019-07-19 ENCOUNTER — Other Ambulatory Visit: Payer: Self-pay

## 2019-07-19 NOTE — Telephone Encounter (Signed)
   Unsuccessful Phone Outreach Note  07/19/2019 Name: LARA PALINKAS MRN: [id number] DOB: 1996/07/03  Referred by: Danna Hefty, DO,  Reason for referral : Appointment (symptoms of depression) ;.  Akshita A Baxendale is a 23 y.o. year old female who sees [place], [place], DO for primary care.  Phone appointment was scheduled with LCSW at 2:00. Unsuccessful telephone outreach to patient was attempted today reference the above referral.   Left voice message to call LCSW in the next 15 min. To continue phone appointment or patient can call office to reschedule.    Plan:  LCSW will wait for patient to return call or reschedule appointment. Dr. Tarry Kos has been notified of this outreach and plan.  Casimer Lanius, [facility]   [phone] 2:14 PM

## 2019-07-23 ENCOUNTER — Telehealth: Payer: Self-pay | Admitting: Licensed Clinical Social Worker

## 2019-07-23 NOTE — Telephone Encounter (Signed)
   Unsuccessful Phone Outreach Note  07/23/2019 Name: Emma Cantu MRN: [id number] DOB: [birth year]  Referred by: Danna Hefty, DO,  Reason for referral : Care Coordination and Depression ;.  Emma Cantu is a 23 y.o. year old female who sees [place], [place], DO for primary care.    Patient left voice message explaining she missed phone appointment last week due to starting a new job and wanted to reschedule.  Returned call to patient however unsuccessful telephone outreach. Left voice message to call LCSW with times and day that work with her her new schedule   Plan: LCSW will wait for return call.  Casimer Lanius, [place]   [phone] 9:29 AM

## 2019-08-06 ENCOUNTER — Other Ambulatory Visit: Payer: Self-pay

## 2019-08-06 DIAGNOSIS — A6000 Herpesviral infection of urogenital system, unspecified: Secondary | ICD-10-CM

## 2019-08-06 MED ORDER — VALACYCLOVIR HCL 500 MG PO TABS: 500.0000 mg | ORAL_TABLET | Freq: Every day | ORAL | 2 refills | Status: DC

## 2019-09-25 ENCOUNTER — Other Ambulatory Visit: Payer: Self-pay

## 2019-09-25 ENCOUNTER — Ambulatory Visit (INDEPENDENT_AMBULATORY_CARE_PROVIDER_SITE_OTHER): Payer: Medicaid Other | Admitting: Family Medicine

## 2019-09-25 ENCOUNTER — Ambulatory Visit: Payer: Medicaid Other | Admitting: Family Medicine

## 2019-09-25 VITALS — BP 122/74 | HR 97 | Wt 225.0 lb

## 2019-09-25 DIAGNOSIS — F418 Other specified anxiety disorders: Secondary | ICD-10-CM | POA: Diagnosis not present

## 2019-09-25 DIAGNOSIS — R5383 Other fatigue: Secondary | ICD-10-CM

## 2019-09-25 NOTE — Progress Notes (Signed)
Subjective:  Emma Cantu is a 23 y.o. female who presents to the [facility] today with a chief complaint of fatigue.   HPI: Fatigue Patient complaining of generalized fatigue, no significant cardinal symptom.  No shortness of breath, no chest pain, no recent injuries.  Just generally feels she does not have the energy that she used to.  No prior medical history which would lead to obvious assumption as cause.  Anxiety about health Earlier this year patient had a ankle injury at that then complicated into pulmonary pleasant.  She is healed well from this but has a very understandable concern now whenever she has any symptoms that it will become something serious.  We discussed that this was a very rare occurrence that she went through which she understands on a conscious level but her concern with minor symptoms turning into severe illnesses has been growing, she has been googling symptoms and diagnoses multiple times per day.  It is beginning to impact her happiness and general life function.  She says she is safe and that wants to be healthy.  Objective:  Physical Exam: BP 122/74   Pulse 97   Wt 225 lb (102.1 kg)   LMP 09/17/2019 (Approximate)   SpO2 98%   BMI 38.62 kg/m   Gen: NAD, pleasant and  comfortably CV: RRR with no murmurs appreciated Pulm: NWOB, CTAB with no crackles, wheezes, or rhonchi MSK: no edema, cyanosis, or clubbing noted Skin: warm, dry Neuro: grossly normal, moves all extremities Psych: Normal affect and thought content  Results for orders placed or performed in visit on 09/25/19 (from the past 72 hour(s))  CBC     Status: None   Collection Time: 09/25/19  4:31 PM  Result Value Ref Range   WBC 9.9 3.4 - 10.8 x10E3/uL   RBC 4.55 3.77 - 5.28 x10E6/uL   Hemoglobin 13.5 11.1 - 15.9 g/dL   Hematocrit 39.7 34.0 - 46.6 %   MCV 87 79 - 97 fL   MCH 29.7 26.6 - 33.0 pg   MCHC 34.0 31.5 - 35.7 g/dL   RDW 13.8 11.7 - 15.4 %   Platelets 302 150 - 450 x10E3/uL  TSH      Status: None   Collection Time: 09/25/19  4:31 PM  Result Value Ref Range   TSH 1.330 0.450 - 4.500 uIU/mL  Basic Metabolic Panel     Status: None   Collection Time: 09/25/19  4:31 PM  Result Value Ref Range   Glucose 88 65 - 99 mg/dL   BUN 12 6 - 20 mg/dL   Creatinine, Ser 0.78 0.57 - 1.00 mg/dL   GFR calc non Af Amer 106 >59 mL/min/1.73   GFR calc Af Amer 122 >59 mL/min/1.73   BUN/Creatinine Ratio 15 9 - 23   Sodium 141 134 - 144 mmol/L   Potassium 4.4 3.5 - 5.2 mmol/L   Chloride 104 96 - 106 mmol/L   CO2 25 20 - 29 mmol/L   Calcium 9.7 8.7 - 10.2 mg/dL     Assessment/Plan:  Fatigue Patient complaining of generalized fatigue, no significant cardinal symptom.  No shortness of breath, no chest pain, no recent injuries.  Just generally feels she does not have the energy that she used to.  No prior medical history which would lead to obvious assumption as cause.  Did check CBC, BMP, TSH which were all reassuringly completely normal(patient has been notified).  Suspect there might be a psychological component to this as patient  has been impacted greatly by a prior injury to her ankle which then complicated into a pulmonary embolism.  This is been very stressful for her.  Anxiety about health Earlier this year patient had a ankle injury at that then complicated into pulmonary pleasant.  She is healed well from this but has a very understandable concern now whenever she has any symptoms that it will become something serious.  We discussed that this was a very rare occurrence that she went through which she understands on a conscious level but her concern with minor symptoms turning into severe illnesses has been growing, she has been googling symptoms and diagnoses multiple times per day.  It is beginning to impact her happiness and general life function.  She says she is safe and that wants to be healthy.  We discussed that establishing care with a psychologist would be extremely  helpful to her and she would like to do this, referral is been placed.  If she does not hear from them in the next week or 2 she will contact back, we also discussed emergency department precautions.   Marthenia Rolling, DO FAMILY MEDICINE RESIDENT - PGY3 09/27/2019 7:43 AM

## 2019-09-25 NOTE — Patient Instructions (Signed)
It was a pleasure to see you today! Thank you for choosing [facility] for your primary care. Emma Cantu was seen for fatigue and anxiety. Come back to the clinic to get your Pap completed.  Thank you for coming in, today we ordered some blood test to check up on your fatigued even having.  Were also putting in a referral for you to establish with psychology to help you with your anxiety issues.  Please let us know if we can do anything for you   Please bring all your medications to every doctors visit   Sign up for My Chart to have easy access to your labs results, and communication with your Primary care physician.     Please check-out at the front desk before leaving the clinic.     Best,  Dr. Sherene Sires FAMILY MEDICINE RESIDENT - PGY3 09/25/2019 3:58 PM

## 2019-09-26 LAB — BASIC METABOLIC PANEL
BUN/Creatinine Ratio: 15 (ref 9–23)
BUN: 12 mg/dL (ref 6–20)
CO2: 25 mmol/L (ref 20–29)
Calcium: 9.7 mg/dL (ref 8.7–10.2)
Chloride: 104 mmol/L (ref 96–106)
Creatinine, Ser: 0.78 mg/dL (ref 0.57–1.00)
GFR calc Af Amer: 122 mL/min/{1.73_m2} (ref >59)
GFR calc non Af Amer: 106 mL/min/{1.73_m2} (ref >59)
Glucose: 88 mg/dL (ref 65–99)
Potassium: 4.4 mmol/L (ref 3.5–5.2)
Sodium: 141 mmol/L (ref 134–144)

## 2019-09-26 LAB — CBC
Hematocrit: 39.7 % (ref 34.0–46.6)
Hemoglobin: 13.5 g/dL (ref 11.1–15.9)
MCH: 29.7 pg (ref 26.6–33.0)
MCHC: 34 g/dL (ref 31.5–35.7)
MCV: 87 fL (ref 79–97)
Platelets: 302 10*3/uL (ref 150–450)
RBC: 4.55 x10E6/uL (ref 3.77–5.28)
RDW: 13.8 % (ref 11.7–15.4)
WBC: 9.9 10*3/uL (ref 3.4–10.8)

## 2019-09-26 LAB — TSH: TSH: 1.33 u[IU]/mL (ref 0.450–4.500)

## 2019-09-27 ENCOUNTER — Encounter: Payer: Self-pay | Admitting: Family Medicine

## 2019-09-27 DIAGNOSIS — R5383 Other fatigue: Secondary | ICD-10-CM | POA: Insufficient documentation

## 2019-09-27 NOTE — Assessment & Plan Note (Signed)
Patient complaining of generalized fatigue, no significant cardinal symptom.  No shortness of breath, no chest pain, no recent injuries.  Just generally feels she does not have the energy that she used to.  No prior medical history which would lead to obvious assumption as cause.  Did check CBC, BMP, TSH which were all reassuringly completely normal(patient has been notified).  Suspect there might be a psychological component to this as patient has been impacted greatly by a prior injury to her ankle which then complicated into a pulmonary embolism.  This is been very stressful for her.

## 2019-09-27 NOTE — Assessment & Plan Note (Signed)
Earlier this year patient had a ankle injury at that then complicated into pulmonary pleasant.  She is healed well from this but has a very understandable concern now whenever she has any symptoms that it will become something serious.  We discussed that this was a very rare occurrence that she went through which she understands on a conscious level but her concern with minor symptoms turning into severe illnesses has been growing, she has been googling symptoms and diagnoses multiple times per day.  It is beginning to impact her happiness and general life function.  She says she is safe and that wants to be healthy.  We discussed that establishing care with a psychologist would be extremely helpful to her and she would like to do this, referral is been placed.  If she does not hear from them in the next week or 2 she will contact back, we also discussed emergency department precautions.

## 2019-10-02 ENCOUNTER — Other Ambulatory Visit: Payer: Self-pay

## 2019-10-02 ENCOUNTER — Encounter: Payer: Self-pay | Admitting: Family Medicine

## 2019-10-02 ENCOUNTER — Ambulatory Visit (INDEPENDENT_AMBULATORY_CARE_PROVIDER_SITE_OTHER): Payer: Medicaid Other | Admitting: Family Medicine

## 2019-10-02 ENCOUNTER — Other Ambulatory Visit (HOSPITAL_COMMUNITY)
Admission: RE | Admit: 2019-10-02 | Discharge: 2019-10-02 | Disposition: A | Discharge status: Home / Self Care | Payer: Medicaid Other | Source: Ambulatory Visit | Attending: Family Medicine | Admitting: Family Medicine

## 2019-10-02 VITALS — BP 132/82 | HR 80 | Wt 225.2 lb

## 2019-10-02 DIAGNOSIS — N898 Other specified noninflammatory disorders of vagina: Secondary | ICD-10-CM

## 2019-10-02 DIAGNOSIS — Z124 Encounter for screening for malignant neoplasm of cervix: Secondary | ICD-10-CM | POA: Diagnosis not present

## 2019-10-02 DIAGNOSIS — N760 Acute vaginitis: Secondary | ICD-10-CM

## 2019-10-02 DIAGNOSIS — B9689 Other specified bacterial agents as the cause of diseases classified elsewhere: Secondary | ICD-10-CM

## 2019-10-02 LAB — POCT URINE PREGNANCY: Preg Test, Ur: NEGATIVE

## 2019-10-02 LAB — POCT WET PREP (WET MOUNT)
Clue Cells Wet Prep Whiff POC: NEGATIVE
Trichomonas Wet Prep HPF POC: ABSENT

## 2019-10-02 NOTE — Progress Notes (Signed)
   Subjective:   Patient ID: Emma Cantu    DOB: 1996-07-04, 23 y.o. female   MRN: [id number]  Emma Cantu is a 23 y.o. female with a history of eczema, depression/anxiety particularly about her health, history of pulmonary embolism here for Vaginal Discharge.  Vaginal Discharge:  23 y.o. female complains of clear, white, copious, creamy and malodorous vaginal discharge for 5-7 day(s). Notes it was initially fishy smelling but that has improved.  Denies any itching. Denies abnormal vaginal bleeding, significant pelvic pain or fever. No UTI symptoms. Sexually active with 1 female partner (2 sexual partners in last year), does not use condoms.  Last unprotected intercourse Sept. 5th. Patient has history of Gonorrhea and chlamydia and genital herpes. She does not currently have an outbreak and is on suppressive Valtrex. Denies any STD symptoms in partner.    Patient's last menstrual period was 09/17/2019 (approximate).  No history of STD's.  No abdominal pain, nausea, or vomiting.  No fevers or chills.     Health Maintenance: Due for pap-smear.   Review of Systems:  Per HPI.   [place], medications and smoking status reviewed.  Objective:   BP 132/82   Pulse 80   Wt 225 lb 3.2 oz (102.2 kg)   LMP 09/17/2019 (Approximate)   SpO2 98%   BMI 38.66 kg/m  Vitals and nursing note reviewed.  Gen:  She appears well, afebrile.  Abdomen: benign, soft, nontender, no masses.  GYN:  External genitalia within normal limits.  Vaginal mucosa pink, moist, normal rugae. Small irregular (~0.3cm in diameter) flat hypopigmented macule at 7'oclock of cervix, nontender to palpation.  Thin white cervical discharge. No bleeding appreciated.  Bimanual exam revealed normal, nongravid uterus.  No cervical motion tenderness. No adnexal masses bilaterally.     Assessment & Plan:   Vaginal discharge Pap-smear and pelvic exam performed today. Wet prep notable for BV. Trich negative. GC/Cl pending. Urine  pregnancy test negative. Patient made aware of results.  - follow up STD screen - Metronidazole 500mg  x 7 days for BV.   Screening for malignant neoplasm of cervix Pap-smear performed today. Pelvic exam notable for small irregularly shaped nontender macule at 7 o'clock position of cervix. Unclear etiology at this time. Discussed with patient. Will await cytology results. Pending results, consider referral to Gyn for further evaluation given patient's history of health associated anxiety.  Orders Placed This Encounter  Procedures  . POCT Wet Prep [employer])  . POCT urine pregnancy   No orders of the defined types were placed in this encounter.   Mina Marble, DO PGY-2, [place] [facility] 10/04/2019 1:49 AM

## 2019-10-02 NOTE — Patient Instructions (Addendum)
Thank you so much for coming to see me today.  Today we did your pap-smear and also tested for STD's. I will call you with your results.  Depending on the results, we can send in the referral to gynecology for further evaluation of the small spot on your cervix.  Please do no hesitate to call if you have any questions or concerns.  Take care, Dr. Tarry Kos

## 2019-10-04 DIAGNOSIS — Z124 Encounter for screening for malignant neoplasm of cervix: Secondary | ICD-10-CM | POA: Insufficient documentation

## 2019-10-04 DIAGNOSIS — N898 Other specified noninflammatory disorders of vagina: Secondary | ICD-10-CM | POA: Insufficient documentation

## 2019-10-04 NOTE — Assessment & Plan Note (Addendum)
Pap-smear and pelvic exam performed today. Wet prep notable for BV. Trich negative. GC/Cl pending. Urine pregnancy test negative. Will contact patient with results.  - follow up STD screen - Metronidazole 500mg  x 7 days for BV.

## 2019-10-04 NOTE — Assessment & Plan Note (Signed)
Pap-smear performed today. Pelvic exam notable for small irregularly shaped nontender macule at 7 o'clock position of cervix. Unclear etiology at this time. Discussed with patient. Will await cytology results. Pending results, consider referral to Gyn for further evaluation given patient's history of health associated anxiety.

## 2019-10-06 ENCOUNTER — Telehealth: Payer: Self-pay | Admitting: Family Medicine

## 2019-10-06 ENCOUNTER — Other Ambulatory Visit: Payer: Self-pay

## 2019-10-06 ENCOUNTER — Emergency Department (HOSPITAL_COMMUNITY): Payer: Medicaid Other

## 2019-10-06 ENCOUNTER — Emergency Department (HOSPITAL_COMMUNITY)
Admission: EM | Admit: 2019-10-06 | Discharge: 2019-10-06 | Disposition: A | Discharge status: Home / Self Care | Payer: Medicaid Other | Attending: Emergency Medicine | Admitting: Emergency Medicine

## 2019-10-06 ENCOUNTER — Encounter (HOSPITAL_COMMUNITY): Payer: Self-pay | Admitting: *Deleted

## 2019-10-06 DIAGNOSIS — N201 Calculus of ureter: Secondary | ICD-10-CM | POA: Insufficient documentation

## 2019-10-06 DIAGNOSIS — N202 Calculus of kidney with calculus of ureter: Secondary | ICD-10-CM | POA: Diagnosis not present

## 2019-10-06 DIAGNOSIS — R1031 Right lower quadrant pain: Secondary | ICD-10-CM | POA: Diagnosis present

## 2019-10-06 DIAGNOSIS — B9689 Other specified bacterial agents as the cause of diseases classified elsewhere: Secondary | ICD-10-CM

## 2019-10-06 DIAGNOSIS — N760 Acute vaginitis: Secondary | ICD-10-CM | POA: Diagnosis not present

## 2019-10-06 LAB — CBC WITH DIFFERENTIAL/PLATELET
Abs Immature Granulocytes: 0.03 10*3/uL (ref 0.00–0.07)
Basophils Absolute: 0 10*3/uL (ref 0.0–0.1)
Basophils Relative: 0 %
Eosinophils Absolute: 0.1 10*3/uL (ref 0.0–0.5)
Eosinophils Relative: 1 %
HCT: 45.1 % (ref 36.0–46.0)
Hemoglobin: 14.4 g/dL (ref 12.0–15.0)
Immature Granulocytes: 0 %
Lymphocytes Relative: 24 %
Lymphs Abs: 2.4 10*3/uL (ref 0.7–4.0)
MCH: 29 pg (ref 26.0–34.0)
MCHC: 31.9 g/dL (ref 30.0–36.0)
MCV: 90.9 fL (ref 80.0–100.0)
Monocytes Absolute: 0.5 10*3/uL (ref 0.1–1.0)
Monocytes Relative: 5 %
Neutro Abs: 7 10*3/uL (ref 1.7–7.7)
Neutrophils Relative %: 70 %
Platelets: 255 10*3/uL (ref 150–400)
RBC: 4.96 MIL/uL (ref 3.87–5.11)
RDW: 14.3 % (ref 11.5–15.5)
WBC: 10.1 10*3/uL (ref 4.0–10.5)
nRBC: 0 % (ref 0.0–0.2)

## 2019-10-06 LAB — COMPREHENSIVE METABOLIC PANEL
ALT: 19 U/L (ref 0–44)
AST: 18 U/L (ref 15–41)
Albumin: 4.2 g/dL (ref 3.5–5.0)
Alkaline Phosphatase: 72 U/L (ref 38–126)
Anion gap: 9 (ref 5–15)
BUN: 17 mg/dL (ref 6–20)
CO2: 23 mmol/L (ref 22–32)
Calcium: 9.4 mg/dL (ref 8.9–10.3)
Chloride: 104 mmol/L (ref 98–111)
Creatinine, Ser: 0.88 mg/dL (ref 0.44–1.00)
GFR calc Af Amer: >60 mL/min (ref >60)
GFR calc non Af Amer: >60 mL/min (ref >60)
Glucose, Bld: 105 mg/dL — ABNORMAL HIGH (ref 70–99)
Potassium: 4.2 mmol/L (ref 3.5–5.1)
Sodium: 136 mmol/L (ref 135–145)
Total Bilirubin: 0.7 mg/dL (ref 0.3–1.2)
Total Protein: 7.9 g/dL (ref 6.5–8.1)

## 2019-10-06 LAB — URINALYSIS, ROUTINE W REFLEX MICROSCOPIC
Bilirubin Urine: NEGATIVE
Glucose, UA: NEGATIVE mg/dL
Ketones, ur: NEGATIVE mg/dL
Leukocytes,Ua: NEGATIVE
Nitrite: NEGATIVE
Protein, ur: NEGATIVE mg/dL
Specific Gravity, Urine: 1.019 (ref 1.005–1.030)
pH: 6 (ref 5.0–8.0)

## 2019-10-06 LAB — RAPID HIV SCREEN (HIV 1/2 AB+AG)
HIV 1/2 Antibodies: NONREACTIVE
HIV-1 P24 Antigen - HIV24: NONREACTIVE

## 2019-10-06 LAB — I-STAT BETA HCG BLOOD, ED (MC, WL, AP ONLY): I-stat hCG, quantitative: <5 m[IU]/mL (ref <5)

## 2019-10-06 LAB — WET PREP, GENITAL
Sperm: NONE SEEN
Trich, Wet Prep: NONE SEEN
Yeast Wet Prep HPF POC: NONE SEEN

## 2019-10-06 LAB — LIPASE, BLOOD: Lipase: 30 U/L (ref 11–51)

## 2019-10-06 MED ORDER — KETOROLAC TROMETHAMINE 30 MG/ML IJ SOLN
30.0000 mg | Freq: Once | INTRAMUSCULAR | Status: AC
  Administered 2019-10-06: 30 mg via INTRAVENOUS
  Filled 2019-10-06: qty 1

## 2019-10-06 MED ORDER — ONDANSETRON HCL 4 MG/2ML IJ SOLN
4.0000 mg | Freq: Once | INTRAMUSCULAR | Status: AC
  Administered 2019-10-06: 4 mg via INTRAVENOUS
  Filled 2019-10-06: qty 2

## 2019-10-06 MED ORDER — METRONIDAZOLE 500 MG PO TABS: 500.0000 mg | ORAL_TABLET | Freq: Two times a day (BID) | ORAL | 0 refills | Status: AC

## 2019-10-06 MED ORDER — ONDANSETRON HCL 4 MG PO TABS: 4.0000 mg | ORAL_TABLET | Freq: Four times a day (QID) | ORAL | 0 refills | Status: DC

## 2019-10-06 MED ORDER — HYDROCODONE-ACETAMINOPHEN 5-325 MG PO TABS: 1.0000 | ORAL_TABLET | Freq: Four times a day (QID) | ORAL | 0 refills | Status: DC | PRN

## 2019-10-06 MED ORDER — SODIUM CHLORIDE 0.9 % IV BOLUS
1000.0000 mL | Freq: Once | INTRAVENOUS | Status: AC
  Administered 2019-10-06: 1000 mL via INTRAVENOUS

## 2019-10-06 MED ORDER — TAMSULOSIN HCL 0.4 MG PO CAPS: 0.4000 mg | ORAL_CAPSULE | Freq: Every day | ORAL | 0 refills | Status: DC

## 2019-10-06 NOTE — Discharge Instructions (Addendum)
You were given a prescription for antibiotics. Please take the antibiotic prescription fully.  Do not drink alcohol while taking this medication.   Today you were diagnosed with a kidney stone on your CT scan.  You will be given a prescription for Flomax, pain medication, and nausea medication.  You should not drive, work, or operate machinery while taking the pain medication as it can make you very drowsy.  You will need to follow-up with urology for reevaluation and for further treatment of your kidney stone.  You will need to return to the emergency department for any fevers, persistent pain, persistent vomiting, inability to urinate, or any new or worsening symptoms.

## 2019-10-06 NOTE — ED Triage Notes (Signed)
Onset of right sided flank pain that radiates to the abdomen about 0540 this morning. Nausea. Clear vaginal discharge.

## 2019-10-06 NOTE — ED Provider Notes (Signed)
[facility] DEPT Provider Note   CSN: [id number] Arrival date & time: 10/06/19  0620     History   Chief Complaint Chief Complaint  Patient presents with  . Flank Pain    HPI Emma Cantu is a 23 y.o. female.     HPI   Pt is a 23 y/o female who presents to the ED today c/o right flank pain that started about 1 hour PTA. Pain started suddenly. Pain is constant and feels like a nagging pain. Pain initially 7/10 and is not 9/10. Pain radiates to her abdomen and to the pelvic area.  She has never had similar pain.  She reports sweats, but has not taken her temp. She reports nausea, but denies vomiting, diarrhea. She feels like she may be constipated, but did have a small BM PTA. She reports abnormal vaginal discharge that started about 2 weeks ago. She is sexually active and states she did not use protection the last time she had intercourse. She was tested for STDs last week and has not received results. Denies urinary sxs.  LMP 09/17/19. Menses have been somewhat irregular for the last 2 months.   Past Medical History:  Diagnosis Date  . Herpes   . Nexplanon insertion 12/2012   Right Arm  . No pertinent past medical history     Patient Active Problem List   Diagnosis Date Noted  . Vaginal discharge 10/04/2019  . Screening for malignant neoplasm of cervix 10/04/2019  . Anxiety about health 07/09/2019  . Depression, major, single episode, moderate ([place]) 07/09/2019  . Eczema 09/23/2016    Past Surgical History:  Procedure Laterality Date  . HERNIA REPAIR    . inguanal hernia repair    . NO PAST SURGERIES    . ORIF ANKLE FRACTURE Right 01/11/2019   Procedure: OPEN REDUCTION INTERNAL FIXATION (ORIF) ANKLE FRACTURE;  Surgeon: Renette Butters, MD;  Location: [facility];  Service: Orthopedics;  Laterality: Right;     OB History    Gravida  1   Para  1   Term  1   Preterm  0   AB  0   Living  1     SAB  0   TAB   0   Ectopic  0   Multiple  0   Live Births  1            Home Medications    Prior to Admission medications   Medication Sig Start Date End Date Taking? Authorizing Provider  valACYclovir (VALTREX) 500 MG tablet Take 1 tablet (500 mg total) by mouth daily. 08/06/19  Yes Mullis, Kiersten P, DO  metroNIDAZOLE (FLAGYL) 500 MG tablet Take 1 tablet (500 mg total) by mouth 2 (two) times daily for 7 days. 10/06/19 10/13/19  Devota Viruet S, PA-C    Family History Family History  Problem Relation Age of Onset  . Cancer Maternal Grandmother   . Cancer Maternal Grandfather   . Diabetes Paternal Grandmother   . Hyperlipidemia Mother   . Hypertension Mother   . Diabetes Paternal Grandfather   . Stroke Other     Social History Social History   Tobacco Use  . Smoking status: Never Smoker  . Smokeless tobacco: Never Used  Substance Use Topics  . Alcohol use: No  . Drug use: No     Allergies   Patient has no known allergies.   Review of Systems Review of Systems  Constitutional: Positive for  diaphoresis. Negative for fever.  HENT: Negative for ear pain and sore throat.   Eyes: Negative for visual disturbance.  Respiratory: Negative for cough and shortness of breath.   Cardiovascular: Negative for chest pain.  Gastrointestinal: Positive for abdominal pain, constipation and nausea. Negative for diarrhea and vomiting.  Genitourinary: Positive for flank pain and vaginal discharge. Negative for dysuria, frequency, hematuria, urgency and vaginal bleeding.  Musculoskeletal: Negative for back pain.  Skin: Negative for rash.  Neurological: Negative for headaches.  All other systems reviewed and are negative.    Physical Exam Updated Vital Signs BP 132/75   Pulse 84   Temp 99.2 F (37.3 C) (Oral)   Resp 16   LMP 09/17/2019 (Approximate)   SpO2 99%   Physical Exam Vitals signs and nursing note reviewed.  Constitutional:      General: She is not in acute distress.     Appearance: She is well-developed.  HENT:     Head: Normocephalic and atraumatic.  Eyes:     Conjunctiva/sclera: Conjunctivae normal.  Neck:     Musculoskeletal: Neck supple.  Cardiovascular:     Rate and Rhythm: Normal rate and regular rhythm.     Pulses: Normal pulses.     Heart sounds: Normal heart sounds. No murmur.  Pulmonary:     Effort: Pulmonary effort is normal. No respiratory distress.     Breath sounds: Normal breath sounds. No wheezing, rhonchi or rales.  Abdominal:     General: Bowel sounds are normal.     Palpations: Abdomen is soft.     Tenderness: There is abdominal tenderness (RUQ > RLQ). There is right CVA tenderness and guarding. There is no rebound.  Genitourinary:    Comments: Exam performed by Karrie Meresortni S Brittain Hosie,  exam chaperoned Date: 10/06/2019 Pelvic exam: normal external genitalia without evidence of trauma. VULVA: normal appearing vulva with no masses, tenderness or lesion. VAGINA: normal appearing vagina with normal color and discharge, no lesions. CERVIX: normal appearing cervix without lesions, cervical motion tenderness absent, cervical os closed with out purulent discharge; vaginal discharge - white/yellow discharge present, Wet prep and DNA probe for chlamydia and GC obtained.   ADNEXA: normal adnexa in size, mild bilat adnexal ttp UTERUS: uterus is normal size, shape, consistency and nontender.  Skin:    General: Skin is warm and dry.  Neurological:     Mental Status: She is alert.     ED Treatments / Results  Labs (all labs ordered are listed, but only abnormal results are displayed) Labs Reviewed  WET PREP, GENITAL - Abnormal; Notable for the following components:      Result Value   Clue Cells Wet Prep HPF POC PRESENT (*)    WBC, Wet Prep HPF POC MODERATE (*)    All other components within normal limits  COMPREHENSIVE METABOLIC PANEL - Abnormal; Notable for the following components:   Glucose, Bld 105 (*)    All other components  within normal limits  URINALYSIS, ROUTINE W REFLEX MICROSCOPIC - Abnormal; Notable for the following components:   Hgb urine dipstick SMALL (*)    Bacteria, UA RARE (*)    All other components within normal limits  CBC WITH DIFFERENTIAL/PLATELET  LIPASE, BLOOD  RAPID HIV SCREEN (HIV 1/2 AB+AG)  RPR  I-STAT BETA HCG BLOOD, ED ([facility], [facility], [facility] ONLY)  GC/CHLAMYDIA PROBE AMP ([place]) NOT AT [facility]    EKG None  Radiology Ct Renal Stone Study  Result Date: 10/06/2019 CLINICAL DATA:  Patient was awoken  this morning with right flank pain that radiated around to her right groin. No hx of kidney stones. Cannot find appropriate structured reason for exam - see REASON FOR EXAM (FREE TEXT) right flank, right abd pain EXAM: CT ABDOMEN AND PELVIS WITHOUT CONTRAST TECHNIQUE: Multidetector CT imaging of the abdomen and pelvis was performed following the standard protocol without IV contrast. COMPARISON:  None. FINDINGS: Lower chest: Lung bases are clear. Hepatobiliary: No focal hepatic lesion. No biliary duct dilatation. Gallbladder is normal. Common bile duct is normal. Pancreas: Pancreas is normal. No ductal dilatation. No pancreatic inflammation. Spleen: Normal spleen Adrenals/urinary tract: Adrenal glands normal. A 2 mm calculus in lower pole of the RIGHT kidney. 1 mm calculus in the mid RIGHT kidney. No hydroureter. There is a 2 mm calculus in the distal RIGHT ureter at the RIGHT ureteral vesicular junction. No nephrolithiasis.  No LEFT ureteral calculi.  No bladder calculi Stomach/Bowel: Stomach, small bowel, appendix, and cecum are normal. The colon and rectosigmoid colon are normal. Vascular/Lymphatic: Abdominal aorta is normal caliber. No periportal or retroperitoneal adenopathy. No pelvic adenopathy. Reproductive: Uterus and ovaries normal Other: No free fluid. Musculoskeletal: No aggressive osseous lesion. IMPRESSION: 1. RIGHT ureteral calculus essentially at the RIGHT vesicoureteral junction. 2. Two  small RIGHT renal calculi. Electronically Signed   By: Genevive Bi M.D.   On: 10/06/2019 12:04    Procedures Procedures (including critical care time)  Medications Ordered in ED Medications  sodium chloride 0.9 % bolus 1,000 mL (0 mLs Intravenous Stopped 10/06/19 0947)  ondansetron (ZOFRAN) injection 4 mg (4 mg Intravenous Given 10/06/19 0740)  ketorolac (TORADOL) 30 MG/ML injection 30 mg (30 mg Intravenous Given 10/06/19 0740)     Initial Impression / Assessment and Plan / ED Course  I have reviewed the triage vital signs and the nursing notes.  Pertinent labs & imaging results that were available during my care of the patient were reviewed by me and considered in my medical decision making (see chart for details).    Final Clinical Impressions(s) / ED Diagnoses   Final diagnoses:  Bacterial vaginosis  Ureteral stone   23 year old female complaining of right flank pain that radiates to the right side of the abdomen and pelvis.  No associated urinary symptoms.  Has had some vaginal discharge.  Right CVA, RUQ and RLQ TTP exam with voluntary guarding. Pelvic with no CMT, mild bilat adnexal ttp.  CBC WNL CMP WNL Lipase WNL Beta hcg negative UA w/o evidence of UTI Wet prep consistent with BV  - Flagyl given GC/chlam, HIV, RPR pending  CT renal stone RIGHT ureteral calculus essentially at the RIGHT vesicoureteral Junction. Two small RIGHT renal calculi.  Pt give IVF, toradol, antiemetics, reassessment she states she feels much improved.  Discussed the findings of CT scan.  Will send patient home with pain medication, nausea medicine, Flomax.  We will also give Flagyl for BV.  Have advised follow-up with urology.  Have advised on strict precautions.  She voices understanding and is in agreement plan.  All questions answered.  Patient stable for discharge.  ED Discharge Orders         Ordered    metroNIDAZOLE (FLAGYL) 500 MG tablet  2 times daily     10/06/19 [address], [place], PA-C 10/06/19 1230    Lorre Nick, MD 10/06/19 1410

## 2019-10-06 NOTE — Telephone Encounter (Signed)
Attempted to contact patient several times to discuss results and provide treatment without success. Appears patient was seen in ED today (10/06/19) and found to have nephrolithiasis. Discharged with pain management and Flomax. BV treated with metronidazole. Will continue to follow up and call when remaining results return.

## 2019-10-07 LAB — RPR: RPR Ser Ql: NONREACTIVE

## 2019-10-08 ENCOUNTER — Encounter: Payer: Self-pay | Admitting: Family Medicine

## 2019-10-08 LAB — GC/CHLAMYDIA PROBE AMP (~~LOC~~) NOT AT ARMC
Chlamydia: NEGATIVE
Neisseria Gonorrhea: NEGATIVE

## 2019-10-14 LAB — CYTOLOGY - PAP
Chlamydia: NEGATIVE
Comment: NEGATIVE
Comment: NORMAL
Diagnosis: NEGATIVE
Neisseria Gonorrhea: NEGATIVE

## 2019-10-14 NOTE — Progress Notes (Signed)
Patient made aware of normal pap results. She voiced concern about cervical spot noted during pelvic exam. Reassured her that given normal exam and pap-smear likely acute changes from BV and not further work up needed at this time. If patient is very concerned we can refer to gyn for second opinion. She did not feel this was necessary at this time. If patient develops any vaginal symptoms again, can take that opportunity to do pelvic exam and relook at cervix to evaluate for resolution. Patient agreeable with plan.

## 2019-10-18 DIAGNOSIS — R311 Benign essential microscopic hematuria: Secondary | ICD-10-CM | POA: Diagnosis not present

## 2019-10-18 DIAGNOSIS — N202 Calculus of kidney with calculus of ureter: Secondary | ICD-10-CM | POA: Diagnosis not present

## 2019-11-01 ENCOUNTER — Other Ambulatory Visit: Payer: Self-pay

## 2019-11-01 DIAGNOSIS — A6000 Herpesviral infection of urogenital system, unspecified: Secondary | ICD-10-CM

## 2019-11-01 MED ORDER — VALACYCLOVIR HCL 500 MG PO TABS: 500.0000 mg | ORAL_TABLET | Freq: Every day | ORAL | 2 refills | Status: DC

## 2019-11-25 ENCOUNTER — Other Ambulatory Visit (HOSPITAL_COMMUNITY)
Admission: RE | Admit: 2019-11-25 | Discharge: 2019-11-25 | Disposition: A | Discharge status: Home / Self Care | Payer: Medicaid Other | Source: Ambulatory Visit | Attending: Family Medicine | Admitting: Family Medicine

## 2019-11-25 ENCOUNTER — Ambulatory Visit (INDEPENDENT_AMBULATORY_CARE_PROVIDER_SITE_OTHER): Payer: Medicaid Other | Admitting: Family Medicine

## 2019-11-25 ENCOUNTER — Other Ambulatory Visit: Payer: Self-pay

## 2019-11-25 VITALS — BP 114/68 | HR 101

## 2019-11-25 DIAGNOSIS — N898 Other specified noninflammatory disorders of vagina: Secondary | ICD-10-CM | POA: Diagnosis not present

## 2019-11-25 LAB — POCT WET PREP (WET MOUNT)
Clue Cells Wet Prep Whiff POC: NEGATIVE
Trichomonas Wet Prep HPF POC: ABSENT

## 2019-11-25 LAB — POCT URINALYSIS DIP (MANUAL ENTRY)
Bilirubin, UA: NEGATIVE
Glucose, UA: NEGATIVE mg/dL
Ketones, POC UA: NEGATIVE mg/dL
Leukocytes, UA: NEGATIVE
Nitrite, UA: NEGATIVE
Protein Ur, POC: NEGATIVE mg/dL
Spec Grav, UA: 1.025 (ref 1.010–1.025)
Urobilinogen, UA: 0.2 E.U./dL
pH, UA: 6 (ref 5.0–8.0)

## 2019-11-25 LAB — POCT URINE PREGNANCY: Preg Test, Ur: NEGATIVE

## 2019-11-25 MED ORDER — FLUCONAZOLE 150 MG PO TABS: 150.0000 mg | ORAL_TABLET | Freq: Once | ORAL | 0 refills | Status: AC

## 2019-11-25 NOTE — Assessment & Plan Note (Signed)
This appears to be a new episode of vaginal discharge.  Wet prep shows some yeast without clue cells.  UA is unremarkable.  Plan to treat for vaginal candidiasis and follow-up pending labs. -Diflucan 150 mg x 1

## 2019-11-25 NOTE — Progress Notes (Signed)
    Subjective:  Emma Cantu is a 23 y.o. female who presents to the [facility] today with a chief complaint of vaginal itching.   HPI: Vaginal candidiasis She reports roughly 10 days of vaginal itching.  This seemed to start after washing her vagina with a new soap about 10 days ago.  Since then, she has had significant itching and vaginal irritation with mild white discharge.  She notices some burning with urination.  She has not noticed any significant malodor.  She denies polyuria, abdominal pain, nausea.  She is currently sexually active with 1 partner.  Chief Complaint noted Review of Symptoms - see HPI PMH -1 previous yeast infection this year  Objective:  Physical Exam: BP 114/68   Pulse (!) 101   LMP 11/10/2019 (Exact Date)   SpO2 97%    GU: Mild discomfort with insertion of the speculum.  Normal vaginal mucosa.  Mild leukorrhea.  Samples taken.  No obvious skin abnormalities.  Results for orders placed or performed in visit on 11/25/19 (from the past 72 hour(s))  POCT urinalysis dipstick     Status: Abnormal   Collection Time: 11/25/19  2:38 PM  Result Value Ref Range   Color, UA yellow yellow   Clarity, UA cloudy (A) clear   Glucose, UA negative negative mg/dL   Bilirubin, UA negative negative   Ketones, POC UA negative negative mg/dL   Spec Grav, UA 1.025 1.010 - 1.025   Blood, UA trace-intact (A) negative   pH, UA 6.0 5.0 - 8.0   Protein Ur, POC negative negative mg/dL   Urobilinogen, UA 0.2 0.2 or 1.0 E.U./dL   Nitrite, UA Negative Negative   Leukocytes, UA Negative Negative  POCT Wet Prep [facility] [place])     Status: Abnormal   Collection Time: 11/25/19  2:59 PM  Result Value Ref Range   Source Wet Prep POC vaginal    WBC, Wet Prep HPF POC 5-10    Bacteria Wet Prep HPF POC Many (A) Few   Clue Cells Wet Prep HPF POC None None   Clue Cells Wet Prep Whiff POC Negative Whiff    Yeast Wet Prep HPF POC Few (A) None   KOH Wet Prep POC None None   Trichomonas Wet Prep  HPF POC Absent Absent  POCT urine pregnancy     Status: None   Collection Time: 11/25/19  3:21 PM  Result Value Ref Range   Preg Test, Ur Negative Negative     Assessment/Plan:  Vaginal discharge This appears to be a new episode of vaginal discharge.  Wet prep shows some yeast without clue cells.  UA is unremarkable.  Plan to treat for vaginal candidiasis and follow-up pending labs. -Diflucan 150 mg x 1  Screening for STIs She reports she is currently sexually active with 1 partner.  She does have recent STI panel that is unremarkable.  She requested to be screened for everything again today's visit. -Follow-up HIV, RPR, gonorrhea/chlamydia

## 2019-11-26 LAB — CERVICOVAGINAL ANCILLARY ONLY
Chlamydia: NEGATIVE
Comment: NEGATIVE
Comment: NORMAL
Neisseria Gonorrhea: NEGATIVE

## 2019-11-26 LAB — HIV ANTIBODY (ROUTINE TESTING W REFLEX): HIV Screen 4th Generation wRfx: NONREACTIVE

## 2019-11-26 LAB — RPR: RPR Ser Ql: NONREACTIVE

## 2020-01-15 ENCOUNTER — Other Ambulatory Visit: Payer: Self-pay

## 2020-01-15 ENCOUNTER — Ambulatory Visit (INDEPENDENT_AMBULATORY_CARE_PROVIDER_SITE_OTHER): Payer: Medicaid Other | Admitting: Family Medicine

## 2020-01-15 ENCOUNTER — Encounter: Payer: Self-pay | Admitting: Family Medicine

## 2020-01-15 VITALS — BP 122/64 | HR 87

## 2020-01-15 DIAGNOSIS — Z3009 Encounter for other general counseling and advice on contraception: Secondary | ICD-10-CM

## 2020-01-15 DIAGNOSIS — N926 Irregular menstruation, unspecified: Secondary | ICD-10-CM | POA: Diagnosis not present

## 2020-01-15 LAB — POCT URINE PREGNANCY: Preg Test, Ur: NEGATIVE

## 2020-01-15 NOTE — Assessment & Plan Note (Signed)
Urine pregnancy test negative today. Given history of DVT, discussed appropriate contraception methods including Nexplanon, progesterone only pills, IUD, depo-provera. Patient opted for the [name]. Scheduled for 01/23/20 during [facility]. Patient instructed to use condoms until appointment.

## 2020-01-15 NOTE — Progress Notes (Signed)
   Subjective:   Patient ID: Emma Cantu    DOB: Jul 20, 1996, 24 y.o. female   MRN: [id number]  Emma Cantu is a 24 y.o. female here for late period.  Missed Period: Patient presents today after missing her period for 5 days. She notes she started her period today. She notes she is not currently on birth control but they use a condom with sexual intercourse every time. She is interested in starting birth control. She was on Nexplanon in the past but this was discontinued when she got a blood clot after she broke her ankle. Denies any history of migraines with aura. Denies tobacco use.  Review of Systems:  Per HPI.   PMFSH, medications and smoking status reviewed.  Objective:   BP 122/64   Pulse 87   LMP 12/13/2019   SpO2 97%  Vitals and nursing note reviewed.  General: Pleasant young female, sitting comfortably in exam chair  Resp: Speaking in full sentences, unlabored breathing MSK:  gait normal Neuro: Alert and oriented, speech normal  Assessment & Plan:   Encounter for counseling regarding contraception Urine pregnancy test negative today. Given history of DVT, discussed appropriate contraception methods including Nexplanon, progesterone only pills, IUD, depo-provera. Patient opted for the [name]. Scheduled for 01/23/20 during [facility]. Patient instructed to use condoms until appointment.   Orders Placed This Encounter  Procedures  . POCT urine pregnancy    Orpah Cobb, DO PGY-2, [facility] 01/15/2020 1:58 PM

## 2020-01-15 NOTE — Patient Instructions (Signed)
Your pregnancy test is negative today. I have scheduled you to have your Mirena placed on 01/23/20 at 11:30 am. Use condoms in the mean time to protect yourself from pregnancy and STD's. Take some ibuprofen/Motrin prior to appointment to help with cramps. I recommend taking Motrin every 6 hours that day as well. Please call if you have any questions or concerns.  Take care, Dr. Mauri Reading

## 2020-01-23 ENCOUNTER — Other Ambulatory Visit: Payer: Self-pay

## 2020-01-23 ENCOUNTER — Ambulatory Visit (INDEPENDENT_AMBULATORY_CARE_PROVIDER_SITE_OTHER): Payer: Medicaid Other | Admitting: Family Medicine

## 2020-01-23 VITALS — BP 102/70 | HR 80 | Temp 98.5°F | Wt 230.0 lb

## 2020-01-23 DIAGNOSIS — Z30430 Encounter for insertion of intrauterine contraceptive device: Secondary | ICD-10-CM

## 2020-01-23 LAB — POCT URINE PREGNANCY: Preg Test, Ur: NEGATIVE

## 2020-01-23 NOTE — Progress Notes (Signed)
IUD INSERTION: Patient given informed consent, signed copy in the chart..  Negative pregnancy confirmed.  Appropriate time out taken.   Sterile instruments and technique was used. Cervix brought into view with use of speculum and then cleansed three times with  betadine swabs.  A tenaculum was placed into the anterior lip of the cervix and a uterine sound was used to measure uterine size.   A Mirena  IUD was placed into the endometrial cavity, deployed and secured. The applicator was removed. The strings were trimmed to 2 centimeters.   There were no complications and the patient tolerated the procedure well.   She was given handouts for post procedure instructions and information about the IUD including a card with the time of recommended removal. She was reminded that the IUD does not protect against sexually transmitted diseases.  

## 2020-01-23 NOTE — Patient Instructions (Signed)

## 2020-01-27 ENCOUNTER — Encounter: Payer: Self-pay | Admitting: Family Medicine

## 2020-01-27 MED ORDER — LEVONORGESTREL 20 MCG/24HR IU IUD
1.0000 | INTRAUTERINE_SYSTEM | Freq: Once | INTRAUTERINE | Status: AC
  Administered 2020-01-23: 1 via INTRAUTERINE

## 2020-02-03 ENCOUNTER — Other Ambulatory Visit: Payer: Self-pay

## 2020-02-03 DIAGNOSIS — A6000 Herpesviral infection of urogenital system, unspecified: Secondary | ICD-10-CM

## 2020-02-03 MED ORDER — VALACYCLOVIR HCL 500 MG PO TABS: 500.0000 mg | ORAL_TABLET | Freq: Every day | ORAL | 2 refills | Status: DC

## 2020-04-10 ENCOUNTER — Other Ambulatory Visit: Payer: Self-pay

## 2020-04-10 ENCOUNTER — Ambulatory Visit (INDEPENDENT_AMBULATORY_CARE_PROVIDER_SITE_OTHER): Payer: Medicaid Other | Admitting: Family Medicine

## 2020-04-10 VITALS — BP 106/78 | HR 95 | Ht 64.0 in | Wt 241.4 lb

## 2020-04-10 DIAGNOSIS — Z6841 Body Mass Index (BMI) 40.0 and over, adult: Secondary | ICD-10-CM | POA: Diagnosis not present

## 2020-04-10 DIAGNOSIS — R21 Rash and other nonspecific skin eruption: Secondary | ICD-10-CM | POA: Diagnosis not present

## 2020-04-13 NOTE — Assessment & Plan Note (Signed)
Advised to stop irritating the skin and pat dry lightly after shower.  Can use Vaseline or a bacitracin ointment to prevent sticking.

## 2020-04-13 NOTE — Assessment & Plan Note (Signed)
Did discuss weight control and patient said that she would be interested in speaking to nutrition.  Current BMI over 40

## 2020-04-13 NOTE — Progress Notes (Signed)
    SUBJECTIVE:   CHIEF COMPLAINT / HPI:   Patient with skin irritation of umbilicus.  Expect there was moisture related skin breakdown, this caused some itchiness which patient then attempted to treat with vigorous abrasive treatment of a washcloth which caused more skin trauma and now she has some very raw skin around the umbilicus.  No indication of purulence or cellulitis at this time.  No fevers or bleeding  PERTINENT  PMH / PSH: Obesity  OBJECTIVE:   BP 106/78   Pulse 95   Ht 5\' 4"  (1.626 m)   Wt 241 lb 6.4 oz (109.5 kg)   LMP 04/10/2020   SpO2 100%   BMI 41.44 kg/m   General: Pleasant and in no distress, afebrile Skin exam: Umbilicus with internal skin trauma after patient said she "really scraped it hard "with a washcloth, no sign of cellulitis or purulence, hemostatic.  ASSESSMENT/PLAN:   Rash Advised to stop irritating the skin and pat dry lightly after shower.  Can use Vaseline or a bacitracin ointment to prevent sticking.  Class 3 severe obesity with body mass index (BMI) of 40.0 to 44.9 in adult [facility]) Did discuss weight control and patient said that she would be interested in speaking to nutrition.  Current BMI over 40     [facility], DO [facility]

## 2020-05-11 ENCOUNTER — Other Ambulatory Visit: Payer: Self-pay

## 2020-05-11 DIAGNOSIS — A6000 Herpesviral infection of urogenital system, unspecified: Secondary | ICD-10-CM

## 2020-05-11 MED ORDER — VALACYCLOVIR HCL 500 MG PO TABS: 500.0000 mg | ORAL_TABLET | Freq: Every day | ORAL | 2 refills | Status: DC

## 2020-06-04 ENCOUNTER — Ambulatory Visit: Payer: Medicaid Other | Admitting: Registered"

## 2020-06-30 ENCOUNTER — Ambulatory Visit (INDEPENDENT_AMBULATORY_CARE_PROVIDER_SITE_OTHER): Payer: Medicaid Other | Admitting: Family Medicine

## 2020-06-30 ENCOUNTER — Other Ambulatory Visit: Payer: Self-pay

## 2020-06-30 VITALS — BP 120/62 | HR 86 | Ht 64.0 in | Wt 247.2 lb

## 2020-06-30 DIAGNOSIS — L299 Pruritus, unspecified: Secondary | ICD-10-CM

## 2020-06-30 DIAGNOSIS — S90222A Contusion of left lesser toe(s) with damage to nail, initial encounter: Secondary | ICD-10-CM

## 2020-06-30 MED ORDER — CETIRIZINE HCL 10 MG PO TABS: 10.0000 mg | ORAL_TABLET | Freq: Every day | ORAL | 11 refills | Status: DC

## 2020-06-30 NOTE — Progress Notes (Signed)
    SUBJECTIVE:   CHIEF COMPLAINT / HPI: black spot on toe  Emma Cantu is a 24 yo woman presenting today with concern regarding a spot on her left great toe nail. She noticed a black spot in about March or April after she removed old toe nail polish. She thinks it has moved distally on the nail since then, but she is not sure. She does not remember any trauma. No h/o melanoma in family.   PERTINENT  PMH / PSH: non contributory  OBJECTIVE:   BP 120/62   Pulse 86   Ht 5\' 4"  (1.626 m)   Wt 247 lb 4 oz (112.2 kg)   SpO2 96%   BMI 42.44 kg/m   Physical Exam Vitals and nursing note reviewed.  Constitutional:      General: She is not in acute distress.    Appearance: Normal appearance. She is obese. She is not ill-appearing or toxic-appearing.  Skin:    Comments: L great toe: 2 mm dark, circular lesion in mid toe-nail  Neurological:     Mental Status: She is alert.  Psychiatric:        Mood and Affect: Mood normal.        Behavior: Behavior normal.     ASSESSMENT/PLAN:   Subungual contusion of toenail, left, initial encounter Most likely ddx is hematoma, esp as patient noticed several months ago and believes it is moving forward. Must consider melanoma, but unlikely as it appears to be a hematoma, no family hx. Recommend watching for it to continue moving, not grow in size. Pictures taken today. Recommend one month follow up. Precepted with Dr. McDiarmid.     , MD [facility]

## 2020-06-30 NOTE — Patient Instructions (Signed)
It was a pleasure seeing you today!  Most likely what you have on your left big toe is a hematoma (old blood). We expect this to grow out, and so it will move position over time, but should not increase in size. Please take pictures of it and monitor it. I recommend following up in 1 month to check to make sure it is getting better.  Be Well!  Dr. Leary Roca

## 2020-06-30 NOTE — Assessment & Plan Note (Signed)
Most likely ddx is hematoma, esp as patient noticed several months ago and believes it is moving forward. Must consider melanoma, but unlikely as it appears to be a hematoma, no family hx. Recommend watching for it to continue moving, not grow in size. Pictures taken today. Recommend one month follow up. Precepted with Dr. McDiarmid.

## 2020-07-01 ENCOUNTER — Other Ambulatory Visit: Payer: Self-pay

## 2020-07-01 ENCOUNTER — Encounter: Payer: Self-pay | Admitting: Skilled Nursing Facility1

## 2020-07-01 ENCOUNTER — Encounter: Payer: Medicaid Other | Attending: Family Medicine | Admitting: Skilled Nursing Facility1

## 2020-07-01 DIAGNOSIS — Z6841 Body Mass Index (BMI) 40.0 and over, adult: Secondary | ICD-10-CM | POA: Diagnosis present

## 2020-07-01 NOTE — Progress Notes (Signed)
  Assessment:  Primary concerns today: weight management.   Pt is allergic to banana causing tightening of throat.  Pt states she does have a hx of kidney stones. Pt states since her embolism she has had anxiety about her health.  Pt states she is willing to speak with a mental health professional only if they will do it in person.  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  Pt states her sister cooks the meals and would change how she cooks for her but no one else's meals will change. Pt states she feels tired all the time.  Pt has had physical manifestations of uncontrolled anxiety: scrubbing her belly button until pain.   MEDICATIONS: see list   DIETARY INTAKE:  Usual eating pattern includes 2 meals and 0 snacks per day.  Everyday foods include fast food.  Avoided foods include none stated.    24-hr recall: waking 9-10 am B (1pm AM): fast food Snk ( AM): napping L ( 1 AM): sheetz or mcdonalds  Snk ( PM):  D ( PM):  Snk ( PM):  Beverages: water with flavorings, soda, juice  Usual physical activity: ADL's    Intervention:  Nutrition counseling. Dietitian educated pt on healthy meal planning.  Importance of vegetables To have an overall healthy diet, adult men and women are recommended to consume anywhere from 2-3 cups of vegetables daily. Vegetables provide a wide range of vitamins and minerals such as vitamin A, vitamin C, potassium, and folic acid. According to the [employer], including fruit and vegetables daily may reduce the risk of cardiovascular disease, certain cancers, and other non-communicable diseases. Why you need complex carbohydrates: Whole grains and other complex carbohydrates are required to have a healthy diet. Whole grains provide fiber which can help with blood glucose levels and help keep  you satiated. Fruits and starchy vegetables provide essential vitamins and minerals required for immune function, eyesight support, brain support, bone density, wound healing and many other functions within the body. According to the current evidenced based 2020-2025 Dietary Guidelines for Americans, complex carbohydrates are part of a healthy eating pattern which is associated with a decreased risk for type 2 diabetes, cancers, and cardiovascular disease.   Goals: Aim for 64 fluid ounces with at least 32 being plain water; do more faucet water  Try going to sleep after work at [employer] and waking at 3pm; so first meal would be between 3-4:30pm then a meal in the middle of your work day and maybe a snack or 2 while you are there  Get back in touch with your therapist for anxiety   Teaching Method Utilized:  Visual Auditory Hands on  Handouts given during visit include:  Detailed MyPlate  Barriers to learning/adherence to lifestyle change: none identified   Demonstrated degree of understanding via:  Teach Back   Monitoring/Evaluation:  Dietary intake, exercise, and body weight prn.

## 2020-08-10 ENCOUNTER — Ambulatory Visit: Payer: Medicaid Other | Admitting: Skilled Nursing Facility1

## 2020-08-19 ENCOUNTER — Encounter: Payer: Self-pay | Admitting: Family Medicine

## 2020-08-19 ENCOUNTER — Other Ambulatory Visit: Payer: Self-pay

## 2020-08-19 ENCOUNTER — Ambulatory Visit (INDEPENDENT_AMBULATORY_CARE_PROVIDER_SITE_OTHER): Payer: Medicaid Other | Admitting: Family Medicine

## 2020-08-19 VITALS — BP 102/60 | HR 97 | Ht 64.0 in | Wt 247.1 lb

## 2020-08-19 DIAGNOSIS — S90222A Contusion of left lesser toe(s) with damage to nail, initial encounter: Secondary | ICD-10-CM | POA: Diagnosis not present

## 2020-08-19 DIAGNOSIS — T8332XA Displacement of intrauterine contraceptive device, initial encounter: Secondary | ICD-10-CM | POA: Insufficient documentation

## 2020-08-19 IMAGING — CT CT ANGIO CHEST
2 of 7 series · 17 of 46 positions shown · IV contrast (APPLIED)
Comparison: None.

CLINICAL DATA: Chest pain with shortness of breath that began this
morning.

EXAM:
CT ANGIOGRAPHY CHEST WITH CONTRAST
TECHNIQUE: Multidetector CT imaging of the chest was performed using the
standard protocol during bolus administration of intravenous
contrast. Multiplanar CT image reconstructions and MIPs were
obtained to evaluate the vascular anatomy.
CONTRAST:  100mL HLBS8K-A9X IOPAMIDOL (HLBS8K-A9X) INJECTION 76%

[Series 8: thins · axial · 0.86mm/px · z∈[-686,-441]mm · 14 of 394 slices shown]
[im 22/394  lung]
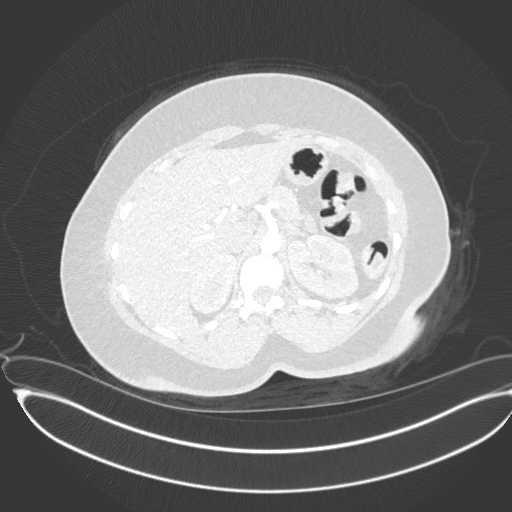
[im 44/394  soft-tissue]
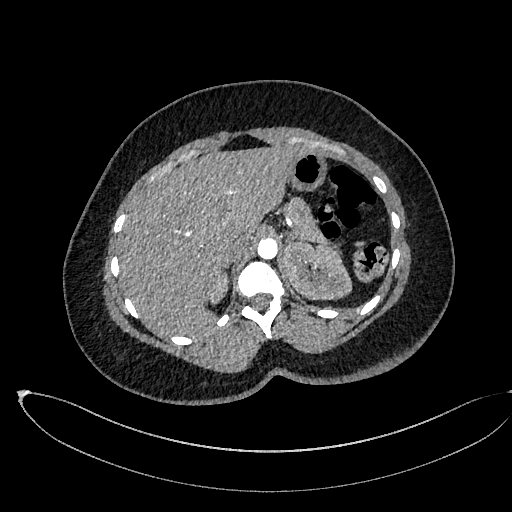
[im 88/394  lung]
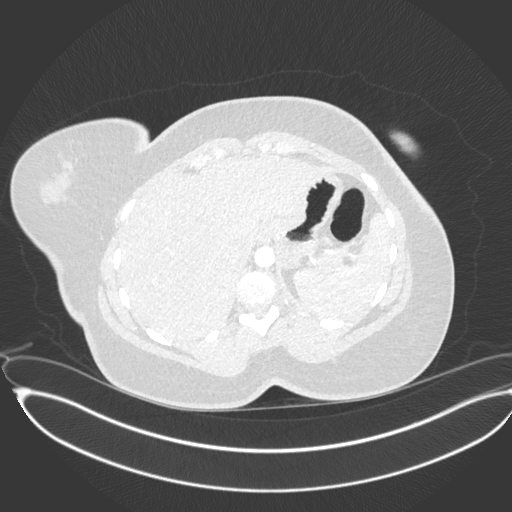
[im 110/394  soft-tissue]
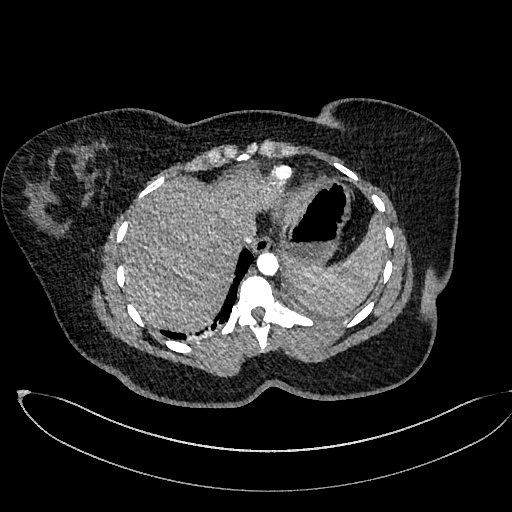
[im 132/394  lung]
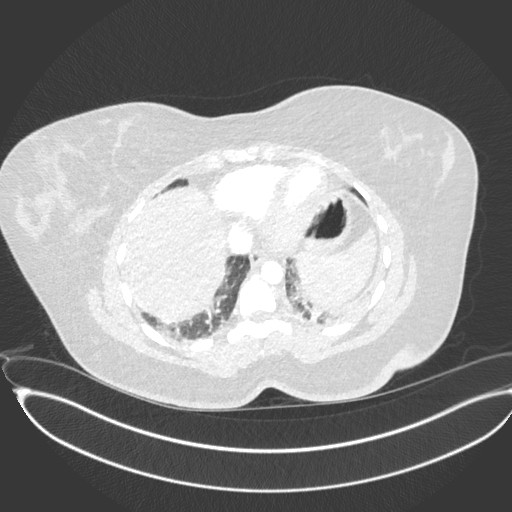
[im 153/394  soft-tissue]
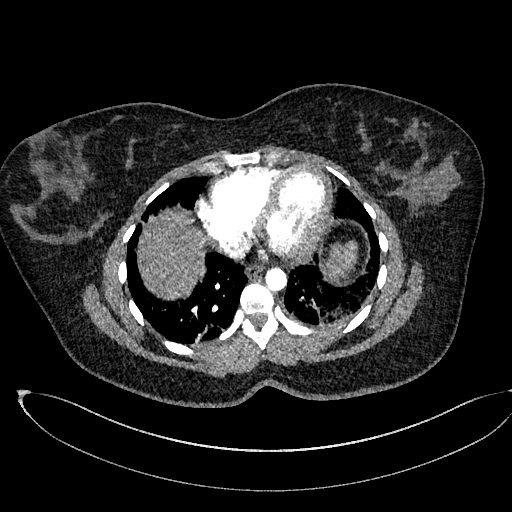
[im 175/394  lung]
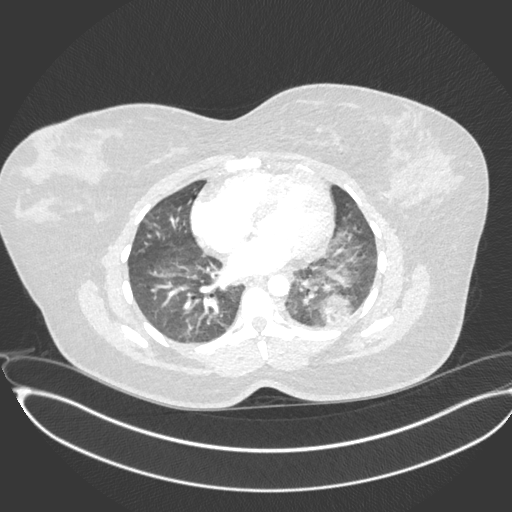
[im 219/394  soft-tissue]
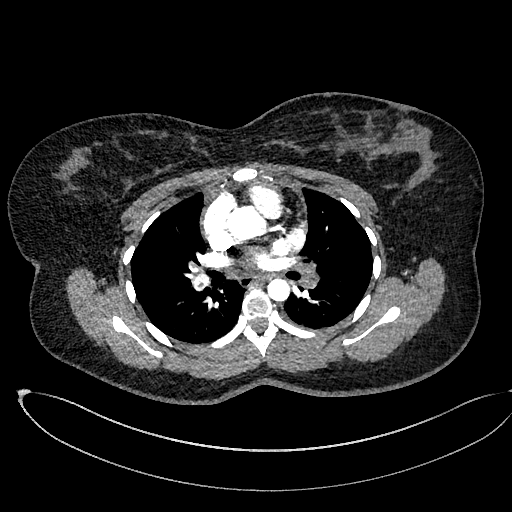
[im 241/394  lung]
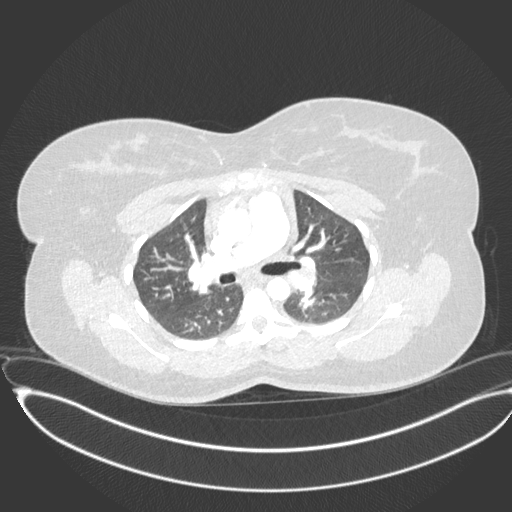
[im 263/394  soft-tissue]
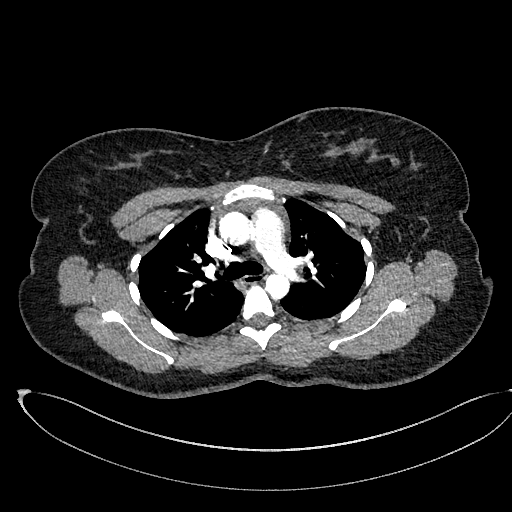
[im 284/394  lung]
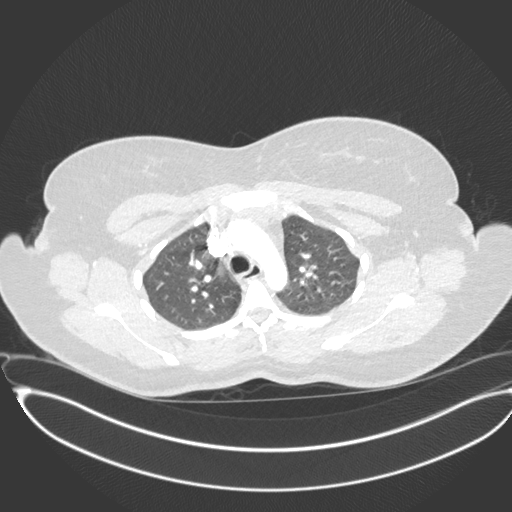
[im 306/394  soft-tissue]
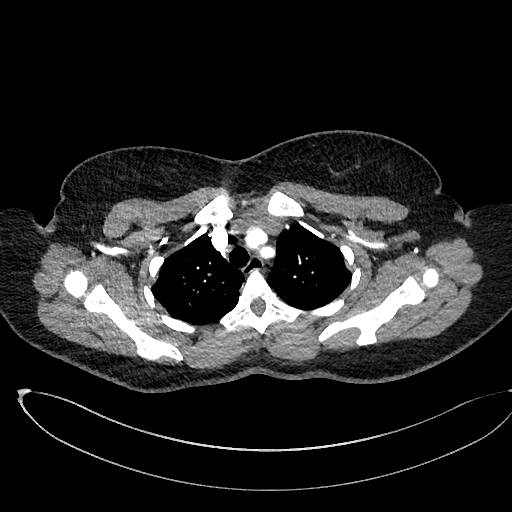
[im 350/394  lung]
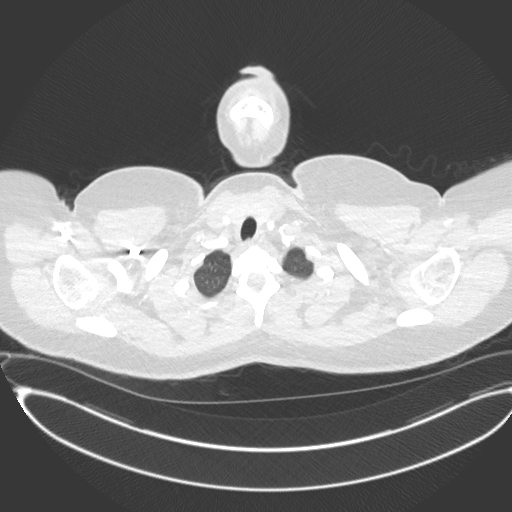
[im 372/394  soft-tissue]
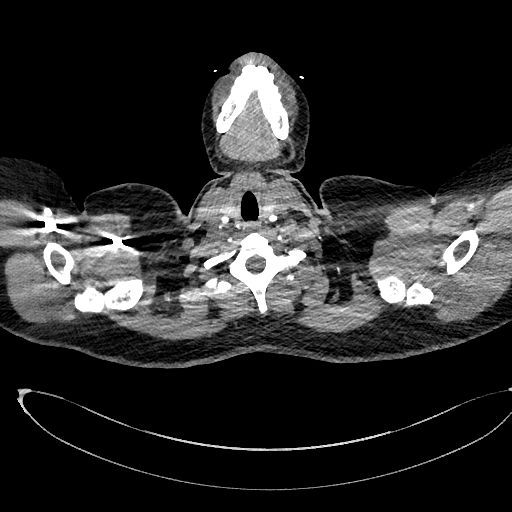

[Series 9: cor · coronal · 0.56mm/px · 3 of 138 slices shown]
[im 35/138  soft-tissue]
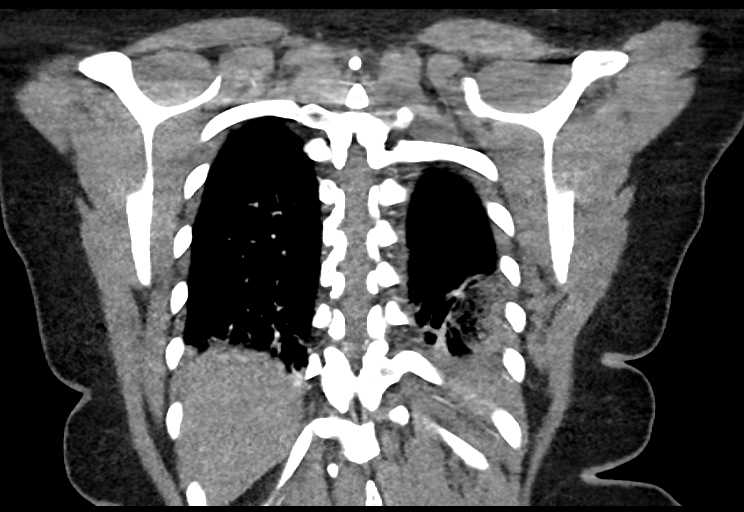
[im 69/138  soft-tissue]
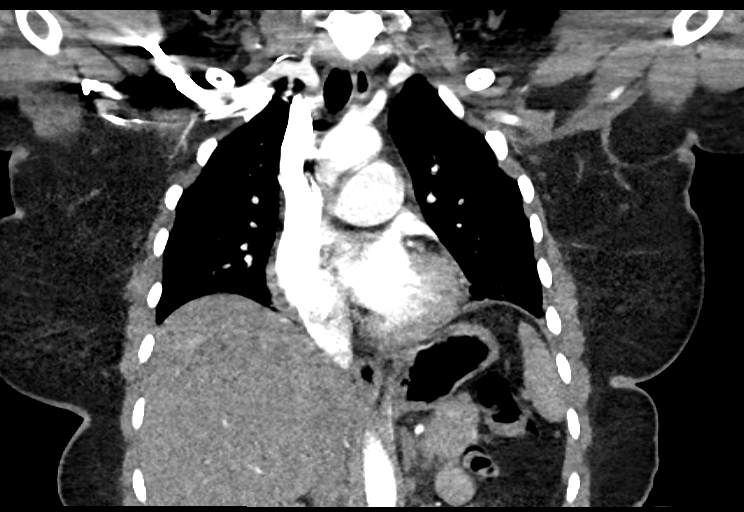
[im 103/138  soft-tissue]
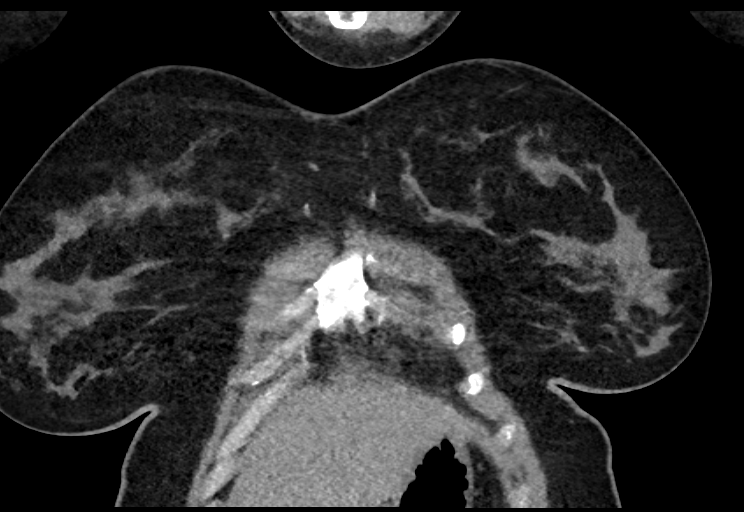

[17 of 46 positions shown; findings below may reference images not displayed]

FINDINGS: Cardiovascular: Satisfactory opacification of the pulmonary arteries
to the segmental level. Pulmonary emboli in the right lower lobe and
right middle lobe lobar and segmental branches. Extensive left lower
lobar, segmental and subsegmental pulmonary emboli. Normal heart
size. Right heart strain. No pericardial effusion.

Mediastinum/Nodes: No enlarged mediastinal, hilar, or axillary lymph
nodes. Thyroid gland, trachea, and esophagus demonstrate no
significant findings.

Lungs/Pleura: No pleural effusion or pneumothorax. Left lower lobe
peripheral airspace disease which may reflect atelectasis versus
developing pulmonary infarct.

Upper Abdomen: No acute abnormality.

Musculoskeletal: No chest wall abnormality. No acute or significant
osseous findings.

Review of the MIP images confirms the above findings.
IMPRESSION: 1. Bilateral acute pulmonary emboli. Positive for acute PE with CT
evidence of right heart strain (RV/LV Ratio = 1.2) consistent with
at least submassive (intermediate risk) PE. The presence of right
heart strain has been associated with an increased risk of morbidity
and mortality. Please activate Code PE by paging [phone].
2. Left lower lobe peripheral airspace disease which may reflect
atelectasis versus developing pulmonary infarct.

Critical Value/emergent results were called by telephone at the time
of interpretation on 01/29/2019 at [DATE] to FARDEENAH KOTIAH , who
verbally acknowledged these results.

## 2020-08-19 NOTE — Patient Instructions (Addendum)
It was a pleasure to see you today!  For the spot on your toe, let's continue to watch for resolution. I expect it could take 3-5 more months for it to completely grow out. Keep watching for this and follow up if the spot does not grow out or if it changes.  For your IUD, I have ordered a transvaginal ultrasound to check for placement to make sure it is correct. I will let you know the results. My nurse will tell you when it is scheduled.  Be well!  Dr. Leary Roca

## 2020-08-19 NOTE — Progress Notes (Signed)
    SUBJECTIVE:   CHIEF COMPLAINT / HPI:  F/u toe spot, cannot find strings on IUD  Toe: patient seen 7 weeks ago for dark spot on toe. She found it after removing nail polish. She did not recall trauma occurring, no h/o of toe problem before. Pictures taken on 06/30/20 and compared to today reveals no change in shape or size, it is further along the nail bed, appears to be growing out. Patient reports that she recently had toe trauma and only remembered it because of our discussion at the last visit and believes it is possible she had trauma and did not remember it.  IUD: patient had IUD placed in January of this year. Initially she had unscheduled bleeding and intense lower back cramps similar to period or with contractions. The unscheduled bleeding has stopped completely and the low back cramps have gone away, now occur rarely, may 1-2x per month. However, she cannot feel strings and is concerned for placement.  PERTINENT  PMH / PSH: IUD, obesity  OBJECTIVE:   BP 102/60   Pulse 97   Ht 5\' 4"  (1.626 m)   Wt 112.1 kg   SpO2 98%   BMI 42.42 kg/m   Physical Exam Vitals and nursing note reviewed.  Constitutional:      General: She is not in acute distress.    Appearance: Normal appearance. She is obese. She is not ill-appearing, toxic-appearing or diaphoretic.  Genitourinary:    Comments: PELVIC:  Normal appearing external female genitalia, normal vaginal epithelium, no abnormal discharge. Able to visualize anterior lip of cervix, difficulty observing posterior lip, small tip of blue plastic barely visible Skin:    General: Skin is warm and dry.     Comments: 4x66mm lesion on left big toe  Neurological:     Mental Status: She is alert.  Psychiatric:        Mood and Affect: Mood normal.        Behavior: Behavior normal.    ASSESSMENT/PLAN:   Subungual contusion of toenail, left, initial encounter Lesion remains the same size and shape, though further up toenail than previously.  Suspect most likely dx is subungual hematoma. Also possibly an onychomycosis. Will continue to monitor, expect it to fully grow out in 3-5 months. Recommend patient follow up if it changes size, shape, or does not improve in 3-4 months.  IUD strings lost Difficulty visualizing on exam and with abdominal 11m, likely limited due to habitus. Will obtain transvaginal pelvic US to confirm placement and location. Scheduled for 9/1.     11/1, MD [facility]

## 2020-08-19 NOTE — Assessment & Plan Note (Addendum)
Difficulty visualizing on exam and with abdominal US, likely limited due to habitus. Will obtain transvaginal pelvic US to confirm placement and location. Scheduled for 9/1.

## 2020-08-19 NOTE — Assessment & Plan Note (Signed)
Lesion remains the same size and shape, though further up toenail than previously. Suspect most likely dx is subungual hematoma. Also possibly an onychomycosis. Will continue to monitor, expect it to fully grow out in 3-5 months. Recommend patient follow up if it changes size, shape, or does not improve in 3-4 months.

## 2020-08-26 ENCOUNTER — Other Ambulatory Visit: Payer: Self-pay

## 2020-08-26 ENCOUNTER — Ambulatory Visit (HOSPITAL_COMMUNITY)
Admission: RE | Admit: 2020-08-26 | Discharge: 2020-08-26 | Disposition: A | Discharge status: Home / Self Care | Payer: Medicaid Other | Source: Ambulatory Visit | Attending: Family Medicine | Admitting: Family Medicine

## 2020-08-26 DIAGNOSIS — T8332XA Displacement of intrauterine contraceptive device, initial encounter: Secondary | ICD-10-CM | POA: Diagnosis present

## 2020-08-26 DIAGNOSIS — N854 Malposition of uterus: Secondary | ICD-10-CM | POA: Diagnosis not present

## 2020-08-26 DIAGNOSIS — Z30431 Encounter for routine checking of intrauterine contraceptive device: Secondary | ICD-10-CM | POA: Diagnosis not present

## 2020-09-16 ENCOUNTER — Ambulatory Visit (INDEPENDENT_AMBULATORY_CARE_PROVIDER_SITE_OTHER): Payer: Medicaid Other

## 2020-09-16 ENCOUNTER — Other Ambulatory Visit: Payer: Self-pay

## 2020-09-16 DIAGNOSIS — Z23 Encounter for immunization: Secondary | ICD-10-CM | POA: Diagnosis not present

## 2020-09-16 NOTE — Progress Notes (Signed)
   Covid-19 Vaccination Clinic  Name:  Emma Cantu    MRN: [id number] DOB: Jan 10, 1996  09/16/2020  [name] was observed post Covid-19 immunization for 15 minutes without incident. She was provided with Vaccine Information Sheet and instruction to access the V-Safe system.   [name] was instructed to call 911 with any severe reactions post vaccine: Marland Kitchen Difficulty breathing  . Swelling of face and throat  . A fast heartbeat  . A bad rash all over body  . Dizziness and weakness   #1 Covid Vaccine administered RD without complication. #2 Covid Vaccine due 10/07/2020.

## 2020-10-04 ENCOUNTER — Other Ambulatory Visit: Payer: Self-pay

## 2020-10-04 ENCOUNTER — Emergency Department (HOSPITAL_COMMUNITY)
Admission: EM | Admit: 2020-10-04 | Discharge: 2020-10-04 | Disposition: A | Discharge status: Home / Self Care | Payer: Medicaid Other | Attending: Emergency Medicine | Admitting: Emergency Medicine

## 2020-10-04 ENCOUNTER — Encounter (HOSPITAL_COMMUNITY): Payer: Self-pay

## 2020-10-04 ENCOUNTER — Emergency Department (HOSPITAL_COMMUNITY): Payer: Medicaid Other

## 2020-10-04 DIAGNOSIS — R0602 Shortness of breath: Secondary | ICD-10-CM | POA: Diagnosis not present

## 2020-10-04 DIAGNOSIS — M79601 Pain in right arm: Secondary | ICD-10-CM | POA: Diagnosis not present

## 2020-10-04 MED ORDER — IBUPROFEN 200 MG PO TABS
600.0000 mg | ORAL_TABLET | Freq: Once | ORAL | Status: AC
  Administered 2020-10-04: 600 mg via ORAL
  Filled 2020-10-04: qty 3

## 2020-10-04 NOTE — ED Triage Notes (Signed)
Pt arrived via walk in, c/o SOB x2 weeks and right arm pain this morning.

## 2020-10-04 NOTE — ED Provider Notes (Signed)
[facility]-EMERGENCY DEPT Provider Note   CSN: [id number] Arrival date & time: 10/04/20  1059     History Chief Complaint  Patient presents with  . Shortness of Breath  . Arm Pain    Emma Cantu is a 24 y.o. female.  The history is provided by the patient.  Arm Pain This is a new problem. The current episode started 6 to 12 hours ago. The problem occurs hourly. The problem has been gradually improving. Associated symptoms include shortness of breath. Pertinent negatives include no chest pain, no abdominal pain and no headaches. Nothing aggravates the symptoms. Nothing relieves the symptoms. She has tried nothing for the symptoms. The treatment provided no relief.       Past Medical History:  Diagnosis Date  . Herpes   . Nexplanon insertion 12/2012   Right Arm  . No pertinent past medical history   . Pulmonary embolism (HCC)    provoked by ankle injury (winter 2020)     Patient Active Problem List   Diagnosis Date Noted  . IUD strings lost 08/19/2020  . Subungual contusion of toenail, left, initial encounter 06/30/2020  . Class 3 severe obesity with body mass index (BMI) of 40.0 to 44.9 in adult (HCC) 04/13/2020  . Anxiety about health 07/09/2019  . Encounter for counseling regarding contraception 05/23/2017  . Eczema 09/23/2016  . Rash 11/16/2012    Past Surgical History:  Procedure Laterality Date  . HERNIA REPAIR    . inguanal hernia repair    . NO PAST SURGERIES    . ORIF ANKLE FRACTURE Right 01/11/2019   Procedure: OPEN REDUCTION INTERNAL FIXATION (ORIF) ANKLE FRACTURE;  Surgeon: Sheral Apley, MD;  Location: [facility];  Service: Orthopedics;  Laterality: Right;     OB History    Gravida  1   Para  1   Term  1   Preterm  0   AB  0   Living  1     SAB  0   TAB  0   Ectopic  0   Multiple  0   Live Births  1           Family History  Problem Relation Age of Onset  . Cancer Maternal  Grandmother   . Cancer Maternal Grandfather   . Diabetes Paternal Grandmother   . Hyperlipidemia Mother   . Hypertension Mother   . Diabetes Paternal Grandfather   . Stroke Other     Social History   Tobacco Use  . Smoking status: Never Smoker  . Smokeless tobacco: Never Used  Vaping Use  . Vaping Use: Never used  Substance Use Topics  . Alcohol use: No  . Drug use: No    Home Medications Prior to Admission medications   Medication Sig Start Date End Date Taking? Authorizing Provider  cetirizine (ZYRTEC) 10 MG tablet Take 1 tablet (10 mg total) by mouth daily. 06/30/20   Shirlean Mylar, MD  HYDROcodone-acetaminophen (NORCO/VICODIN) 5-325 MG tablet Take 1 tablet by mouth every 6 (six) hours as needed. 10/06/19   Couture, Cortni S, PA-C  ondansetron (ZOFRAN) 4 MG tablet Take 1 tablet (4 mg total) by mouth every 6 (six) hours. 10/06/19   Couture, Cortni S, PA-C  tamsulosin (FLOMAX) 0.4 MG CAPS capsule Take 1 capsule (0.4 mg total) by mouth daily. 10/06/19   Couture, Cortni S, PA-C  valACYclovir (VALTREX) 500 MG tablet Take 1 tablet (500 mg total) by mouth daily. 05/11/20  Mullis, Kiersten P, DO    Allergies    Banana  Review of Systems   Review of Systems  Constitutional: Negative for chills and fever.  HENT: Negative for ear pain and sore throat.   Eyes: Negative for pain and visual disturbance.  Respiratory: Positive for shortness of breath. Negative for cough.   Cardiovascular: Negative for chest pain and palpitations.  Gastrointestinal: Negative for abdominal pain and vomiting.  Genitourinary: Negative for dysuria and hematuria.  Musculoskeletal: Positive for arthralgias. Negative for back pain.  Skin: Negative for color change and rash.  Neurological: Negative for seizures, syncope, weakness and headaches.  All other systems reviewed and are negative.   Physical Exam Updated Vital Signs  ED Triage Vitals  Enc Vitals Group     BP 10/04/20 1111 (!) 143/94      Pulse Rate 10/04/20 1111 85     Resp 10/04/20 1111 18     Temp 10/04/20 1111 98.5 F (36.9 C)     Temp Source 10/04/20 1111 Oral     SpO2 10/04/20 1111 100 %     Weight --      Height --      Head Circumference --      Peak Flow --      Pain Score 10/04/20 1113 4     Pain Loc --      Pain Edu? --      Excl. in GC? --     Physical Exam Vitals and nursing note reviewed.  Constitutional:      General: She is not in acute distress.    Appearance: She is well-developed. She is not ill-appearing.  HENT:     Head: Normocephalic and atraumatic.     Right Ear: Tympanic membrane normal.     Left Ear: Tympanic membrane normal.     Nose: Nose normal.     Mouth/Throat:     Mouth: Mucous membranes are moist.     Pharynx: No oropharyngeal exudate or posterior oropharyngeal erythema.  Eyes:     Conjunctiva/sclera: Conjunctivae normal.     Pupils: Pupils are equal, round, and reactive to light.  Cardiovascular:     Rate and Rhythm: Normal rate and regular rhythm.     Pulses: Normal pulses.     Heart sounds: Normal heart sounds. No murmur heard.   Pulmonary:     Effort: Pulmonary effort is normal. No respiratory distress.     Breath sounds: Normal breath sounds.  Abdominal:     General: Abdomen is flat.     Palpations: Abdomen is soft.     Tenderness: There is no abdominal tenderness.  Musculoskeletal:        General: Normal range of motion.     Cervical back: Normal range of motion and neck supple.  Skin:    General: Skin is warm and dry.     Capillary Refill: Capillary refill takes less than 2 seconds.  Neurological:     General: No focal deficit present.     Mental Status: She is alert and oriented to person, place, and time.     Cranial Nerves: No cranial nerve deficit.     Sensory: No sensory deficit.     Motor: No weakness.     Coordination: Coordination normal.  Psychiatric:        Mood and Affect: Mood normal.     ED Results / Procedures / Treatments   Labs (all  labs ordered are listed, but only abnormal results are  displayed) Labs Reviewed - No data to display  EKG EKG Interpretation  Date/Time:  Sunday October 04 2020 11:10:55 EDT Ventricular Rate:  80 PR Interval:    QRS Duration: 85 QT Interval:  353 QTC Calculation: 408 R Axis:   58 Text Interpretation: Sinus rhythm 12 Lead; Mason-Likar No significant change since last tracing Confirmed by Linwood Dibbles [id number]) on 10/04/2020 2:50:32 PM   Radiology DG Chest 2 View  Result Date: 10/04/2020 CLINICAL DATA:  Shortness of breath for 2 weeks. Right chest and arm pain beginning this morning. EXAM: CHEST - 2 VIEW COMPARISON:  None. FINDINGS: The heart size and mediastinal contours are within normal limits. Both lungs are clear. The visualized skeletal structures are unremarkable. IMPRESSION: Negative.  No active cardiopulmonary disease. Electronically Signed   By: Danae Orleans M.D.   On: 10/04/2020 12:11    Procedures Procedures (including critical care time)  Medications Ordered in ED Medications  ibuprofen (ADVIL) tablet 600 mg (has no administration in time range)    ED Course  I have reviewed the triage vital signs and the nursing notes.  Pertinent labs & imaging results that were available during my care of the patient were reviewed by me and considered in my medical decision making (see chart for details).    MDM Rules/Calculators/A&P                          Emma Cantu is a 24 year old female with no significant medical history presents to the ED with right arm pain.  Normal vitals.  No fever.  Has had some intermittent shortness of breath as well during the last 2 weeks.  However no infectious symptoms.  Chest x-ray without any signs of pneumonia, no pneumothorax.  No chest pain.  No PE or DVT risk factors.  No ACS risk factors.  Patient is PERC negative and doubt PE.  EKG shows sinus rhythm.  No ischemic changes.  Patient has reproducible pain in the right upper bicep area.   Likely MSK type pain.  Possibly tendinitis from overuse.  Recommend Tylenol Motrin.  She denies pregnancy.  Recommend that she continue her course of Tylenol Motrin and rest.  Recommend follow-up with primary care doctor and discharged in the ED in good condition.  This chart was dictated using voice recognition software.  Despite best efforts to proofread,  errors can occur which can change the documentation meaning.     Final Clinical Impression(s) / ED Diagnoses Final diagnoses:  Right arm pain    Rx / DC Orders ED Discharge Orders    None       Virgina Norfolk, DO 10/04/20 1532

## 2020-10-04 NOTE — Discharge Instructions (Signed)
Take 1000 mg of Tylenol up to 4 times a day as needed for pain, take 600 mg of ibuprofen up to 3 times a day as needed for pain as well

## 2020-10-05 ENCOUNTER — Telehealth: Payer: Self-pay

## 2020-10-05 NOTE — Telephone Encounter (Signed)
Transition Care Management Follow-up Telephone Call  Date of discharge and from where: [facility] 10/04/2020  How have you been since you were released from the hospital? Doing well  Any questions or concerns? No  Items Reviewed:  Did the pt receive and understand the discharge instructions provided? Yes   Medications obtained and verified? Yes   Any new allergies since your discharge? No   Dietary orders reviewed? Yes  Do you have support at home? Yes   Functional Questionnaire: (I = Independent and D = Dependent) ADLs: I  Bathing/Dressing- I  Meal Prep- I  Eating- I       Maintaining continence- I  Transferring/Ambulation- I  Managing Meds- I  Follow up appointments reviewed:   PCP Hospital f/u appt confirmed? Yes  10/07/2020 Dr. Sharol Harness   [facility] f/u appt confirmed? No  .  Are transportation arrangements needed? No   If their condition worsens, is the pt aware to call PCP or go to the Emergency Dept.? Yes  Was the patient provided with contact information for the PCP's office or ED? Yes  Was to pt encouraged to call back with questions or concerns? Yes

## 2020-10-07 ENCOUNTER — Encounter: Payer: Self-pay | Admitting: Family Medicine

## 2020-10-07 ENCOUNTER — Other Ambulatory Visit: Payer: Self-pay

## 2020-10-07 ENCOUNTER — Ambulatory Visit (INDEPENDENT_AMBULATORY_CARE_PROVIDER_SITE_OTHER): Payer: Medicaid Other | Admitting: Family Medicine

## 2020-10-07 ENCOUNTER — Ambulatory Visit (INDEPENDENT_AMBULATORY_CARE_PROVIDER_SITE_OTHER): Payer: Medicaid Other

## 2020-10-07 DIAGNOSIS — Z23 Encounter for immunization: Secondary | ICD-10-CM

## 2020-10-07 DIAGNOSIS — E7800 Pure hypercholesterolemia, unspecified: Secondary | ICD-10-CM | POA: Diagnosis not present

## 2020-10-07 NOTE — Progress Notes (Signed)
    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  Labs are on patient's cellular device and patient has specific questions regarding labs highlighted and red.  The labs include elevated cholesterol 218, low HDL 49, LDL elevated 126.  Patient also has A1c of 5.6.  Answered patient's questions as to recommended lifestyle changes and discussed lower and upper limits of normal for other values including on the remainder of her lab work.   PERTINENT  PMH / PSH:  Obesity  Anxiety about Health    OBJECTIVE:   BP 120/72   Pulse (!) 107   Wt 247 lb 3.2 oz (112.1 kg)   SpO2 99%   BMI 42.43 kg/m    General: female appearing stated age, slightly anxious appearing  Cardio: Normal S1 and S2, no S3 or S4. Rhythm is regular. No murmurs or rubs.  Bilateral radial pulses palpable Pulm: Clear to auscultation bilaterally, no crackles, wheezing, or diminished breath sounds. Normal respiratory effort Abdomen: Bowel sounds normal. Abdomen soft and non-tender.  Extremities: No peripheral edema. Warm/ well perfused.     ASSESSMENT/PLAN:   Elevated LDL cholesterol level Counseled on diet and decreasing carbohydrates and animal fats, improving fat choices with plant based choices  Handout on preventing type 2 diabetes and elevated cholesterol with diet changes given to patient. Offered patient opportunity to schedule appointment and repeat labs in 3 months after implementing dietary and lifestyle changes.     Ronnald Ramp, MD [facility]

## 2020-10-07 NOTE — Patient Instructions (Signed)
It was a pleasure to see you today!  Thank you for choosing [facility] for your primary care.  Emma Cantu was seen for lab discussion.   Our plans for today were:  Your labs indicate that your cholesterol levels are high and that you are at increased risk for development of diabetes.  You do not have diabetes at this time, hemoglobin A1c of 5.6.  I will recommend lifestyle changes including diet with more vegetables and less carbohydrates (bread, rice, pasta, sugary beverages including juice and soda as well as fried foods)   Today you will receive the second dose of your COVID vaccine.    To keep you healthy, please keep in mind the following health maintenance items that you are due for:   1. Influenza vaccine    You should return to our clinic in 3 months for follow-up for diet changes.   Best Wishes,   Dr. Neita GarnetSimmons-Robinson     Preventing High Cholesterol Cholesterol is a white, waxy substance similar to fat that the human body needs to help build cells. The liver makes all the cholesterol that a person's body needs. Having high cholesterol (hypercholesterolemia) increases a person's risk for heart disease and stroke. Extra (excess) cholesterol comes from the food the person eats. High cholesterol can often be prevented with diet and lifestyle changes. If you already have high cholesterol, you can control it with diet and lifestyle changes and with medicine. How can high cholesterol affect me? If you have high cholesterol, deposits (plaques) may build up on the walls of your arteries. The arteries are the blood vessels that carry blood away from your heart. Plaques make the arteries narrower and stiffer. This can limit or block blood flow and cause blood clots to form. Blood clots:  Are tiny balls of cells that form in your blood.  Can move to the heart or brain, causing a heart attack or stroke. Plaques in arteries greatly increase your risk for heart attack  and stroke.Making diet and lifestyle changes can reduce your risk for these conditions that may threaten your life. What can increase my risk? This condition is more likely to develop in people who:  Eat foods that are high in saturated fat or cholesterol. Saturated fat is mostly found in: ? Foods that contain animal fat, such as red meat and some dairy products. ? Certain fatty foods made from plants, such as tropical oils.  Are overweight.  Are not getting enough exercise.  Have a family history of high cholesterol. What actions can I take to prevent this? Nutrition   Eat less saturated fat.  Avoid trans fats (partially hydrogenated oils). These are often found in margarine and in some baked goods, fried foods, and snacks bought in packages.  Avoid precooked or cured meat, such as sausages or meat loaves.  Avoid foods and drinks that have added sugars.  Eat more fruits, vegetables, and whole grains.  Choose healthy sources of protein, such as fish, poultry, lean cuts of red meat, beans, peas, lentils, and nuts.  Choose healthy sources of fat, such as: ? Nuts. ? Vegetable oils, especially olive oil. ? Fish that have healthy fats (omega-3 fatty acids), such as mackerel or salmon. The items listed above may not be a complete list of recommended foods and beverages. Contact a dietitian for more information. Lifestyle  Lose weight if you are overweight. Losing 5-10 lb (2.3-4.5 kg) can help prevent or control high cholesterol. It can also lower your  risk for diabetes and high blood pressure. Ask your health care provider to help you with a diet and exercise plan to lose weight safely.  Do not use any products that contain nicotine or tobacco, such as cigarettes, e-cigarettes, and chewing tobacco. If you need help quitting, ask your health care provider.  Limit your alcohol intake. ? Do not drink alcohol if:  Your health care provider tells you not to drink.  You are pregnant,  may be pregnant, or are planning to become pregnant. ? If you drink alcohol:  Limit how much you use to:  0-1 drink a day for women.  0-2 drinks a day for men.  Be aware of how much alcohol is in your drink. In the [place] one drink equals one 12 oz bottle of beer (355 mL), one 5 oz glass of wine (148 mL), or one 1 oz glass of hard liquor (44 mL). Activity   Get enough exercise. Each week, do at least 150 minutes of exercise that takes a medium level of effort (moderate-intensity exercise). ? This is exercise that:  Makes your heart beat faster and makes you breathe harder than usual.  Allows you to still be able to talk. ? You could exercise in short sessions several times a day or longer sessions a few times a week. For example, on 5 days each week, you could walk fast or ride your bike 3 times a day for 10 minutes each time.  Do exercises as told by your health care provider. Medicines  In addition to diet and lifestyle changes, your health care provider may recommend medicines to help lower cholesterol. This may be a medicine to lower the amount of cholesterol your liver makes. You may need medicine if: ? Diet and lifestyle changes do not lower your cholesterol enough. ? You have high cholesterol and other risk factors for heart disease or stroke.  Take over-the-counter and prescription medicines only as told by your health care provider. General information  Manage your risk factors for high cholesterol. Talk with your health care provider about all your risk factors and how to lower your risk.  Manage other conditions that you have, such as diabetes or high blood pressure (hypertension).  Have blood tests to check your cholesterol levels at regular points in time as told by your health care provider.  Keep all follow-up visits as told by your health care provider. This is important. Where to find more information  American Heart Association: www.heart.org  National  Heart, Lung, and Blood Institute: PopSteam.is Summary  High cholesterol increases your risk for heart disease and stroke. By keeping your cholesterol level low, you can reduce your risk for these conditions.  High cholesterol can often be prevented with diet and lifestyle changes.  Work with your health care provider to manage your risk factors, and have your blood tested regularly. This information is not intended to replace advice given to you by your health care provider. Make sure you discuss any questions you have with your health care provider. Document Revised: 04/05/2019 Document Reviewed: 08/20/2016 Elsevier Patient Education  2020 Elsevier Inc.    Preventing Type 2 Diabetes Mellitus Type 2 diabetes (type 2 diabetes mellitus) is a long-term (chronic) disease that affects blood sugar (glucose) levels. Normally, a hormone called insulin allows glucose to enter cells in the body. The cells use glucose for energy. In type 2 diabetes, one or both of these problems may be present:  The body does not make enough insulin.  The body does not respond properly to insulin that it makes (insulin resistance). Insulin resistance or lack of insulin causes excess glucose to build up in the blood instead of going into cells. As a result, high blood glucose (hyperglycemia) develops, which can cause many complications. Being overweight or obese and having an inactive (sedentary) lifestyle can increase your risk for diabetes. Type 2 diabetes can be delayed or prevented by making certain nutrition and lifestyle changes. What nutrition changes can be made?   Eat healthy meals and snacks regularly. Keep a healthy snack with you for when you get hungry between meals, such as fruit or a handful of nuts.  Eat lean meats and proteins that are low in saturated fats, such as chicken, fish, egg whites, and beans. Avoid processed meats.  Eat plenty of fruits and vegetables and plenty of grains that have  not been processed (whole grains). It is recommended that you eat: ? 1?2 cups of fruit every day. ? 2?3 cups of vegetables every day. ? 6?8 oz of whole grains every day, such as oats, whole wheat, bulgur, brown rice, quinoa, and millet.  Eat low-fat dairy products, such as milk, yogurt, and cheese.  Eat foods that contain healthy fats, such as nuts, avocado, olive oil, and canola oil.  Drink water throughout the day. Avoid drinks that contain added sugar, such as soda or sweet tea.  Follow instructions from your health care provider about specific eating or drinking restrictions.  Control how much food you eat at a time (portion size). ? Check food labels to find out the serving sizes of foods. ? Use a kitchen scale to weigh amounts of foods.  Saute or steam food instead of frying it. Cook with water or broth instead of oils or butter.  Limit your intake of: ? Salt (sodium). Have no more than 1 tsp (2,400 mg) of sodium a day. If you have heart disease or high blood pressure, have less than ? tsp (1,500 mg) of sodium a day. ? Saturated fat. This is fat that is solid at room temperature, such as butter or fat on meat. What lifestyle changes can be made? Activity   Do moderate-intensity physical activity for at least 30 minutes on at least 5 days of the week, or as much as told by your health care provider.  Ask your health care provider what activities are safe for you. A mix of physical activities may be best, such as walking, swimming, cycling, and strength training.  Try to add physical activity into your day. For example: ? Park in spots that are farther away than usual, so that you walk more. For example, park in a far corner of the parking lot when you go to the office or the grocery store. ? Take a walk during your lunch break. ? Use stairs instead of elevators or escalators. Weight Loss  Lose weight as directed. Your health care provider can determine how much weight  loss is best for you and can help you lose weight safely.  If you are overweight or obese, you may be instructed to lose at least 5?7 % of your body weight. Alcohol and Tobacco   Limit alcohol intake to no more than 1 drink a day for nonpregnant women and 2 drinks a day for men. One drink equals 12 oz of beer, 5 oz of wine, or 1 oz of hard liquor.  Do not use any tobacco products, such as cigarettes, chewing tobacco, and e-cigarettes. If you  need help quitting, ask your health care provider. Work With Your Health Care Provider  Have your blood glucose tested regularly, as told by your health care provider.  Discuss your risk factors and how you can reduce your risk for diabetes.  Get screening tests as told by your health care provider. You may have screening tests regularly, especially if you have certain risk factors for type 2 diabetes.  Make an appointment with a diet and nutrition specialist (registered dietitian). A registered dietitian can help you make a healthy eating plan and can help you understand portion sizes and food labels. Why are these changes important?  It is possible to prevent or delay type 2 diabetes and related health problems by making lifestyle and nutrition changes.  It can be difficult to recognize signs of type 2 diabetes. The best way to avoid possible damage to your body is to take actions to prevent the disease before you develop symptoms. What can happen if changes are not made?  Your blood glucose levels may keep increasing. Having high blood glucose for a long time is dangerous. Too much glucose in your blood can damage your blood vessels, heart, kidneys, nerves, and eyes.  You may develop prediabetes or type 2 diabetes. Type 2 diabetes can lead to many chronic health problems and complications, such as: ? Heart disease. ? Stroke. ? Blindness. ? Kidney disease. ? Depression. ? Poor circulation in the feet and legs, which could lead to surgical  removal (amputation) in severe cases. Where to find support  Ask your health care provider to recommend a registered dietitian, diabetes educator, or weight loss program.  Look for local or online weight loss groups.  Join a gym, fitness club, or outdoor activity group, such as a walking club. Where to find more information To learn more about diabetes and diabetes prevention, visit:  American Diabetes Association (ADA): www.diabetes.[employer] of Diabetes and Digestive and Kidney Diseases: ToyArticles.ca To learn more about healthy eating, visit:  The [place]. Department of Agriculture Architect), Choose My Plate: http://yates.biz/  Office of Disease Prevention and Health Promotion (ODPHP), Dietary Guidelines: ListingMagazine.si Summary  You can reduce your risk for type 2 diabetes by increasing your physical activity, eating healthy foods, and losing weight as directed.  Talk with your health care provider about your risk for type 2 diabetes. Ask about any blood tests or screening tests that you need to have. This information is not intended to replace advice given to you by your health care provider. Make sure you discuss any questions you have with your health care provider. Document Revised: 04/05/2019 Document Reviewed: 02/02/2016 Elsevier Patient Education  2020 [employer].

## 2020-10-07 NOTE — Progress Notes (Signed)
° °  Covid-19 Vaccination Clinic  Name:  EMAAN GARY    MRN: [id number] DOB: [birth year]  10/07/2020  Emma Cantu was observed post Covid-19 immunization for 15 minutes without incident. She was provided with Vaccine Information Sheet and instruction to access the V-Safe system.   Emma Cantu was instructed to call 911 with any severe reactions post vaccine:  Difficulty breathing   Swelling of face and throat   A fast heartbeat   A bad rash all over body   Dizziness and weakness   #2 Covid Vaccine administered LD without complication.

## 2020-10-10 DIAGNOSIS — E7800 Pure hypercholesterolemia, unspecified: Secondary | ICD-10-CM | POA: Insufficient documentation

## 2020-10-10 NOTE — Assessment & Plan Note (Signed)
Counseled on diet and decreasing carbohydrates and animal fats, improving fat choices with plant based choices  Handout on preventing type 2 diabetes and elevated cholesterol with diet changes given to patient. Offered patient opportunity to schedule appointment and repeat labs in 3 months after implementing dietary and lifestyle changes.

## 2020-12-22 ENCOUNTER — Other Ambulatory Visit: Payer: Self-pay

## 2020-12-22 DIAGNOSIS — A6000 Herpesviral infection of urogenital system, unspecified: Secondary | ICD-10-CM

## 2020-12-22 MED ORDER — VALACYCLOVIR HCL 500 MG PO TABS: 500.0000 mg | ORAL_TABLET | Freq: Every day | ORAL | 2 refills | Status: DC

## 2020-12-23 DIAGNOSIS — L2084 Intrinsic (allergic) eczema: Secondary | ICD-10-CM | POA: Diagnosis not present

## 2020-12-23 DIAGNOSIS — L219 Seborrheic dermatitis, unspecified: Secondary | ICD-10-CM | POA: Diagnosis not present

## 2021-03-11 NOTE — Telephone Encounter (Signed)
Error

## 2021-08-24 NOTE — Progress Notes (Addendum)
SUBJECTIVE:   Chief compliant/HPI: annual examination  Emma Cantu is a 25 y.o. who presents today for an annual exam.   Vaginal discharge and odor: brown discharge, musk odor off and on in last month. Patient is sexually active, does have a h/o BV, one monogamous partner. She requests routine STI testing today. Will also perform wet prep.   Weight and hyperlipidemia: Patient reports that when she was working third shift she put on more weight because of her odd sleeping hours, sedentary job, feeling more tired from working at night, etc. She had a lipid panel done at work last year that showed total chol 206, HDL 49, and LDL 126. She would like to lose weight and get in better shape. Will obtain CMP, LDL direct.   Sweating/hot: patient reports she is frequently hot and sweaty, even when in [facility], would like to have thyroid checked. Denies palpitations, no change in hair/skin/nails, no constipation or diarrhea.  Review of systems form notable for vaginal discharge.   Updated history tabs and problem list.   OBJECTIVE:   BP 110/68   Pulse 90   Ht '5\' 4"'  (1.626 m)   Wt 253 lb 4 oz (114.9 kg)   SpO2 98%   BMI 43.47 kg/m   Nursing note and vitals reviewed GEN: age appropriate, AAW, resting comfortably in chair, NAD, class III obesity, alert and at baseline HEENT: NCAT. PERRLA. Sclera without injection or icterus. MMM.  Neck: Supple. No LAD, thyroid is smooth and not palpable Cardiac: Regular rate and rhythm. Normal S1/S2. No murmurs, rubs, or gallops appreciated. 2+ radial pulses. Lungs: Clear bilaterally to ascultation. No increased WOB, no accessory muscle usage. No w/r/r. GU: Normal appearing external female genitalia, normal vaginal epithelium, no abnormal discharge.  Ext: no edema Psych: Pleasant and appropriate   ASSESSMENT/PLAN:   Class 3 severe obesity with body mass index (BMI) of 40.0 to 44.9 in adult (HCC) BMI 43, counseled on exercising and nutrition. Goal is to  decrease by 100 kcal per day until reaches 1500 kcal daily and add in exercise starting with 30 mins walking 3x per week. Also considered medication. Patient would be an excellent candidate for GLP1 for weight loss. Will check lipid panel and CMP.  Elevated LDL cholesterol level Due to diet and sedentary lifestyle. Will obtain CMP and lipid panel today to assess.  Sensation of feeling hot Will evaluate for thyroid disorder with TSH. If normal, most likely deconditioning.  Vaginal discharge -Wet prep, GC, HIV, RPR pending. - Patient has h/o BV, but reports every time she uses flagyl to clear BV, she gets a rebound yeast infection. If BV present will send flagyl with diflucan.    Annual Examination  See AVS for age appropriate recommendations.   PHQ score 4, reviewed and discussed. Blood pressure reviewed and at goal.  Asked about intimate partner violence and patient reports no concerns.  The patient currently uses Mirena for contraception.  Advanced directives- none   Considered the following items based upon USPSTF recommendations: HIV testing: discussed and ordered Hepatitis C: discussed and ordered Hepatitis B:  not indicated Syphilis if at high risk: discussed and ordered GC/CT  patient requests. Lipid panel (nonfasting or fasting) discussed based upon AHA recommendations and ordered.  Consider repeat every 4-6 years.  Reviewed risk factors for latent tuberculosis and not indicated  Discussed family history, BRCA testing not indicated.  Cervical cancer screening: prior Pap reviewed, repeat due in October 2023 Immunizations counseled on COVID 3rd  booster and flu vaccine (not available until next month)   Follow up in 1 year or sooner if indicated.    Gladys Damme, MD [place]

## 2021-08-25 ENCOUNTER — Ambulatory Visit (INDEPENDENT_AMBULATORY_CARE_PROVIDER_SITE_OTHER): Payer: Medicaid Other | Admitting: Family Medicine

## 2021-08-25 ENCOUNTER — Encounter: Payer: Self-pay | Admitting: Family Medicine

## 2021-08-25 ENCOUNTER — Other Ambulatory Visit (HOSPITAL_COMMUNITY)
Admission: RE | Admit: 2021-08-25 | Discharge: 2021-08-25 | Disposition: A | Discharge status: Home / Self Care | Payer: Medicaid Other | Source: Ambulatory Visit | Attending: Family Medicine | Admitting: Family Medicine

## 2021-08-25 ENCOUNTER — Other Ambulatory Visit: Payer: Self-pay

## 2021-08-25 VITALS — BP 110/68 | HR 90 | Ht 64.0 in | Wt 253.2 lb

## 2021-08-25 DIAGNOSIS — Z1159 Encounter for screening for other viral diseases: Secondary | ICD-10-CM

## 2021-08-25 DIAGNOSIS — E7800 Pure hypercholesterolemia, unspecified: Secondary | ICD-10-CM | POA: Diagnosis not present

## 2021-08-25 DIAGNOSIS — Z113 Encounter for screening for infections with a predominantly sexual mode of transmission: Secondary | ICD-10-CM | POA: Diagnosis not present

## 2021-08-25 DIAGNOSIS — Z13228 Encounter for screening for other metabolic disorders: Secondary | ICD-10-CM | POA: Diagnosis not present

## 2021-08-25 DIAGNOSIS — Z0001 Encounter for general adult medical examination with abnormal findings: Secondary | ICD-10-CM | POA: Diagnosis not present

## 2021-08-25 DIAGNOSIS — Z13220 Encounter for screening for lipoid disorders: Secondary | ICD-10-CM | POA: Diagnosis not present

## 2021-08-25 DIAGNOSIS — N898 Other specified noninflammatory disorders of vagina: Secondary | ICD-10-CM

## 2021-08-25 DIAGNOSIS — E8881 Metabolic syndrome: Secondary | ICD-10-CM

## 2021-08-25 DIAGNOSIS — Z114 Encounter for screening for human immunodeficiency virus [HIV]: Secondary | ICD-10-CM | POA: Diagnosis not present

## 2021-08-25 DIAGNOSIS — Z6841 Body Mass Index (BMI) 40.0 and over, adult: Secondary | ICD-10-CM | POA: Diagnosis not present

## 2021-08-25 DIAGNOSIS — Z1329 Encounter for screening for other suspected endocrine disorder: Secondary | ICD-10-CM | POA: Diagnosis not present

## 2021-08-25 DIAGNOSIS — R6889 Other general symptoms and signs: Secondary | ICD-10-CM | POA: Diagnosis not present

## 2021-08-25 DIAGNOSIS — Z130 Encounter for screening for diseases of the blood and blood-forming organs and certain disorders involving the immune mechanism: Secondary | ICD-10-CM | POA: Diagnosis not present

## 2021-08-25 LAB — POCT WET PREP (WET MOUNT)
Clue Cells Wet Prep Whiff POC: NEGATIVE
Trichomonas Wet Prep HPF POC: ABSENT

## 2021-08-25 NOTE — Assessment & Plan Note (Addendum)
Will evaluate for thyroid disorder with TSH. If normal, most likely deconditioning.

## 2021-08-25 NOTE — Assessment & Plan Note (Signed)
BMI 43, counseled on exercising and nutrition. Goal is to decrease by 100 kcal per day until reaches 1500 kcal daily and add in exercise starting with 30 mins walking 3x per week. Also considered medication. Patient would be an excellent candidate for GLP1 for weight loss. Will check lipid panel and CMP.

## 2021-08-25 NOTE — Assessment & Plan Note (Signed)
-  Wet prep, GC, HIV, RPR pending. - Patient has h/o BV, but reports every time she uses flagyl to clear BV, she gets a rebound yeast infection. If BV present will send flagyl with diflucan.

## 2021-08-25 NOTE — Patient Instructions (Addendum)
It was a pleasure to see you today!  We will get some labs today.  If they are abnormal or we need to do something about them, I will call you.  If they are normal, I will send you a message on MyChart (if it is active) or a letter in the mail.  If you don't hear from [place] in 2 weeks, please call the office  [phone]. I will call you re: medication after labs return I recommend trying to walk 30 minutes 3x per week for exercise to start as well as ~1500kcal diet, work your way down to this instead of immediately dropping to 1500 kcals as otherwise it will be hard to do. Follow up for weight management if you would like, otherwise 1 year follow up   Be Well,  Dr. Leary Roca  Health Maintenance, Female Adopting a healthy lifestyle and getting preventive care are important in promoting health and wellness. Ask your health care provider about: The right schedule for you to have regular tests and exams. Things you can do on your own to prevent diseases and keep yourself healthy. What should I know about diet, weight, and exercise? Eat a healthy diet  Eat a diet that includes plenty of vegetables, fruits, low-fat dairy products, and lean protein. Do not eat a lot of foods that are high in solid fats, added sugars, or sodium. Maintain a healthy weight Body mass index (BMI) is used to identify weight problems. It estimates body fat based on height and weight. Your health care provider can help determine your BMI and help you achieve or maintain a healthy weight. Get regular exercise Get regular exercise. This is one of the most important things you can do for your health. Most adults should: Exercise for at least 150 minutes each week. The exercise should increase your heart rate and make you sweat (moderate-intensity exercise). Do strengthening exercises at least twice a week. This is in addition to the moderate-intensity exercise. Spend less time sitting. Even light physical activity can be  beneficial. Watch cholesterol and blood lipids Have your blood tested for lipids and cholesterol at 27 years of age, then have this test every 5 years. Have your cholesterol levels checked more often if: Your lipid or cholesterol levels are high. You are older than 27 years of age. You are at high risk for heart disease. What should I know about cancer screening? Depending on your health history and family history, you may need to have cancer screening at various ages. This may include screening for: Breast cancer. Cervical cancer. Colorectal cancer. Skin cancer. Lung cancer. What should I know about heart disease, diabetes, and high blood pressure? Blood pressure and heart disease High blood pressure causes heart disease and increases the risk of stroke. This is more likely to develop in people who have high blood pressure readings, are of African descent, or are overweight. Have your blood pressure checked: Every 3-5 years if you are 5-64 years of age. Every year if you are 34 years old or older. Diabetes Have regular diabetes screenings. This checks your fasting blood sugar level. Have the screening done: Once every three years after age 30 if you are at a normal weight and have a low risk for diabetes. More often and at a younger age if you are overweight or have a high risk for diabetes. What should I know about preventing infection? Hepatitis B If you have a higher risk for hepatitis B, you should be screened for  this virus. Talk with your health care provider to find out if you are at risk for hepatitis B infection. Hepatitis C Testing is recommended for: Everyone born from [birth year] through 1967. Anyone with known risk factors for hepatitis C. Sexually transmitted infections (STIs) Get screened for STIs, including gonorrhea and chlamydia, if: You are sexually active and are younger than 27 years of age. You are older than 27 years of age and your health care provider tells you  that you are at risk for this type of infection. Your sexual activity has changed since you were last screened, and you are at increased risk for chlamydia or gonorrhea. Ask your health care provider if you are at risk. Ask your health care provider about whether you are at high risk for HIV. Your health care provider may recommend a prescription medicine to help prevent HIV infection. If you choose to take medicine to prevent HIV, you should first get tested for HIV. You should then be tested every 3 months for as long as you are taking the medicine. Pregnancy If you are about to stop having your period (premenopausal) and you may become pregnant, seek counseling before you get pregnant. Take 400 to 800 micrograms (mcg) of folic acid every day if you become pregnant. Ask for birth control (contraception) if you want to prevent pregnancy. Osteoporosis and menopause Osteoporosis is a disease in which the bones lose minerals and strength with aging. This can result in bone fractures. If you are 25 years old or older, or if you are at risk for osteoporosis and fractures, ask your health care provider if you should: Be screened for bone loss. Take a calcium or vitamin D supplement to lower your risk of fractures. Be given hormone replacement therapy (HRT) to treat symptoms of menopause. Follow these instructions at home: Lifestyle Do not use any products that contain nicotine or tobacco, such as cigarettes, e-cigarettes, and chewing tobacco. If you need help quitting, ask your health care provider. Do not use street drugs. Do not share needles. Ask your health care provider for help if you need support or information about quitting drugs. Alcohol use Do not drink alcohol if: Your health care provider tells you not to drink. You are pregnant, may be pregnant, or are planning to become pregnant. If you drink alcohol: Limit how much you use to 0-1 drink a day. Limit intake if you are  breastfeeding. Be aware of how much alcohol is in your drink. In the [place] one drink equals one 12 oz bottle of beer (355 mL), one 5 oz glass of wine (148 mL), or one 1 oz glass of hard liquor (44 mL). General instructions Schedule regular health, dental, and eye exams. Stay current with your vaccines. Tell your health care provider if: You often feel depressed. You have ever been abused or do not feel safe at home. Summary Adopting a healthy lifestyle and getting preventive care are important in promoting health and wellness. Follow your health care provider's instructions about healthy diet, exercising, and getting tested or screened for diseases. Follow your health care provider's instructions on monitoring your cholesterol and blood pressure. This information is not intended to replace advice given to you by your health care provider. Make sure you discuss any questions you have with your health care provider. Document Revised: 02/19/2021 Document Reviewed: 12/05/2018 Elsevier Patient Education  2022 [employer].

## 2021-08-25 NOTE — Assessment & Plan Note (Signed)
Due to diet and sedentary lifestyle. Will obtain CMP and lipid panel today to assess.

## 2021-08-26 ENCOUNTER — Other Ambulatory Visit: Payer: Self-pay | Admitting: Family Medicine

## 2021-08-26 ENCOUNTER — Encounter: Payer: Self-pay | Admitting: Family Medicine

## 2021-08-26 DIAGNOSIS — R7303 Prediabetes: Secondary | ICD-10-CM

## 2021-08-26 DIAGNOSIS — E782 Mixed hyperlipidemia: Secondary | ICD-10-CM

## 2021-08-26 DIAGNOSIS — R7301 Impaired fasting glucose: Secondary | ICD-10-CM

## 2021-08-26 DIAGNOSIS — E8881 Metabolic syndrome: Secondary | ICD-10-CM

## 2021-08-26 LAB — COMPREHENSIVE METABOLIC PANEL
ALT: 23 IU/L (ref 0–32)
AST: 20 IU/L (ref 0–40)
Albumin/Globulin Ratio: 1.9 (ref 1.2–2.2)
Albumin: 4.8 g/dL (ref 3.9–5.0)
Alkaline Phosphatase: 81 IU/L (ref 44–121)
BUN/Creatinine Ratio: 11 (ref 9–23)
BUN: 9 mg/dL (ref 6–20)
Bilirubin Total: 0.5 mg/dL (ref 0.0–1.2)
CO2: 23 mmol/L (ref 20–29)
Calcium: 10.2 mg/dL (ref 8.7–10.2)
Chloride: 101 mmol/L (ref 96–106)
Creatinine, Ser: 0.8 mg/dL (ref 0.57–1.00)
Globulin, Total: 2.5 g/dL (ref 1.5–4.5)
Glucose: 99 mg/dL (ref 65–99)
Potassium: 5.4 mmol/L — ABNORMAL HIGH (ref 3.5–5.2)
Sodium: 138 mmol/L (ref 134–144)
Total Protein: 7.3 g/dL (ref 6.0–8.5)
eGFR: 103 mL/min/{1.73_m2} (ref >59)

## 2021-08-26 LAB — RPR: RPR Ser Ql: NONREACTIVE

## 2021-08-26 LAB — TSH: TSH: 1.49 u[IU]/mL (ref 0.450–4.500)

## 2021-08-26 LAB — HCV AB W REFLEX TO QUANT PCR: HCV Ab: <0.1 s/co ratio (ref 0.0–0.9)

## 2021-08-26 LAB — CERVICOVAGINAL ANCILLARY ONLY
Chlamydia: NEGATIVE
Comment: NEGATIVE
Comment: NORMAL
Neisseria Gonorrhea: NEGATIVE

## 2021-08-26 LAB — LDL CHOLESTEROL, DIRECT: LDL Direct: 190 mg/dL — ABNORMAL HIGH (ref 0–99)

## 2021-08-26 LAB — HIV ANTIBODY (ROUTINE TESTING W REFLEX): HIV Screen 4th Generation wRfx: NONREACTIVE

## 2021-08-26 LAB — HCV INTERPRETATION

## 2021-08-26 LAB — HEMOGLOBIN A1C
Est. average glucose Bld gHb Est-mCnc: 117 mg/dL
Hgb A1c MFr Bld: 5.7 % — ABNORMAL HIGH (ref 4.8–5.6)

## 2021-08-26 MED ORDER — METFORMIN HCL ER 500 MG PO TB24: 500.0000 mg | ORAL_TABLET | Freq: Every day | ORAL | 1 refills | Status: DC

## 2021-08-26 MED ORDER — ROSUVASTATIN CALCIUM 10 MG PO TABS: 10.0000 mg | ORAL_TABLET | Freq: Every day | ORAL | 3 refills | Status: DC

## 2021-09-29 ENCOUNTER — Ambulatory Visit (INDEPENDENT_AMBULATORY_CARE_PROVIDER_SITE_OTHER): Payer: Medicaid Other | Admitting: Family Medicine

## 2021-09-29 ENCOUNTER — Encounter: Payer: Self-pay | Admitting: Family Medicine

## 2021-09-29 ENCOUNTER — Ambulatory Visit: Payer: Medicaid Other | Admitting: Family Medicine

## 2021-09-29 ENCOUNTER — Other Ambulatory Visit: Payer: Self-pay

## 2021-09-29 VITALS — BP 120/90 | HR 73 | Ht 64.0 in | Wt 252.5 lb

## 2021-09-29 DIAGNOSIS — N63 Unspecified lump in unspecified breast: Secondary | ICD-10-CM

## 2021-09-29 NOTE — Patient Instructions (Signed)
For your breast pain, Take 1500 mg Tylenol twice a day or 400 mg Ibuprofen twice a day as needed for pain.   Be sure to [facility]udle [facility]   Dr. Rachael Darby

## 2021-09-29 NOTE — Progress Notes (Signed)
breas  SUBJECTIVE:   CHIEF COMPLAINT / HPI:   Chief Complaint  Patient presents with   Breast Pain     Emma Cantu is a 25 y.o. female here for left-sided breast pain for the last couple months.  Dates that her mom performed a breast exam and she felt as if it was more tender on the left and there was a hard area.  States she has gained weight and her breasts have gotten bigger.  Denies discharge from breast, overlying skin or nipple changes.  Says that she is unsure how to exactly do a breast exam which is why her mom helped her.  Family history positive for breast cancer in her maternal grandmother (3s) and her maternal grandfather had lung cancer.  She has an IUD for contraception.  She does not smoke.  PERTINENT  PMH / PSH: reviewed and updated as appropriate   OBJECTIVE:   BP 120/90   Pulse 73   Ht 5\' 4"  (1.626 m)   Wt 252 lb 8 oz (114.5 kg)   SpO2 100%   BMI 43.34 kg/m   GEN: well appearing female in no acute distress  CVS: well perfused  RESP: speaking in full sentences without pause, no respiratory distress  Breasts: breasts without skin or nipple changes or axillary nodes, Breasts: right breast normal without mass; left breast with tender area that is firm and non-mobile inferior medial quadrant, no galactorrhea    ASSESSMENT/PLAN:   Breast mass in female Patient is a 25 year old female with breast cancer family history (late onset in maternal grandmother) and low SES who presented for a couple of months of breast pain.  Etiology and prognosis unsure.  She has an IUD.  She does not smoke.  Noted that her niece sometimes jams into her when she is laying down. Possibly a seroma or muscular strain from her increased breast size.  On exam, she is tender in the left inferior medial quadrant.  There is a firm masslike area. Unable to rule out tumor at this time. Left breast ultrasound with axilla ordered.  Diagnostic mammogram ordered.  Utility and risk of self breast  exams discussed. Over-the-counter analgesics as needed.  Patient agrees with plan.     32, DO PGY-3, [facility] 09/29/2021

## 2021-10-02 ENCOUNTER — Other Ambulatory Visit: Payer: Self-pay

## 2021-10-02 ENCOUNTER — Ambulatory Visit
Admission: RE | Admit: 2021-10-02 | Discharge: 2021-10-02 | Disposition: A | Discharge status: Home / Self Care | Payer: Medicaid Other | Source: Ambulatory Visit | Attending: Family Medicine | Admitting: Family Medicine

## 2021-10-02 DIAGNOSIS — N6489 Other specified disorders of breast: Secondary | ICD-10-CM | POA: Diagnosis not present

## 2021-10-02 DIAGNOSIS — N63 Unspecified lump in unspecified breast: Secondary | ICD-10-CM

## 2021-10-03 ENCOUNTER — Encounter: Payer: Self-pay | Admitting: Family Medicine

## 2021-10-03 DIAGNOSIS — N63 Unspecified lump in unspecified breast: Secondary | ICD-10-CM | POA: Insufficient documentation

## 2021-10-03 NOTE — Assessment & Plan Note (Addendum)
Patient is a 25 year old female with breast cancer family history (late onset in maternal grandmother) and low SES who presented for a couple of months of breast pain.  Etiology and prognosis unsure.  She has an IUD.  She does not smoke.  Noted that her niece sometimes jams into her when she is laying down. Possibly a seroma or muscular strain from her increased breast size.  On exam, she is tender in the left inferior medial quadrant.  There is a firm masslike area. Unable to rule out tumor at this time. Left breast ultrasound with axilla ordered.  Diagnostic mammogram ordered.  Utility and risk of self breast exams discussed. Over-the-counter analgesics as needed.  Patient agrees with plan.

## 2021-10-29 ENCOUNTER — Encounter: Payer: Self-pay | Admitting: Family Medicine

## 2021-10-29 ENCOUNTER — Other Ambulatory Visit: Payer: Self-pay

## 2021-10-29 DIAGNOSIS — R7303 Prediabetes: Secondary | ICD-10-CM

## 2021-10-29 DIAGNOSIS — R7301 Impaired fasting glucose: Secondary | ICD-10-CM

## 2021-10-29 DIAGNOSIS — E8881 Metabolic syndrome: Secondary | ICD-10-CM

## 2021-10-29 MED ORDER — METFORMIN HCL ER 500 MG PO TB24: 500.0000 mg | ORAL_TABLET | Freq: Every day | ORAL | 0 refills | Status: DC

## 2021-10-29 NOTE — Telephone Encounter (Signed)
Metformin given for weight loss. Patient must try this first and follow up prior to being able to do a PA for ozempic. Patient has tried metformin for 2 months, sent refill for 1 month. I will send her MyChart message to follow up with appointment to check weight and discuss options.  Shirlean Mylar, MD [facility], PGY-3

## 2021-10-29 NOTE — Telephone Encounter (Signed)
Patient calls nurse line regarding refill request for metformin. Patient states that she is unsure how long she is supposed to be taking this medication.   Please advise.   Veronda Prude, RN

## 2022-01-12 ENCOUNTER — Encounter: Payer: Self-pay | Admitting: Family Medicine

## 2022-01-12 ENCOUNTER — Ambulatory Visit (INDEPENDENT_AMBULATORY_CARE_PROVIDER_SITE_OTHER): Payer: Medicaid Other | Admitting: Student

## 2022-01-12 ENCOUNTER — Other Ambulatory Visit: Payer: Self-pay

## 2022-01-12 VITALS — BP 118/85 | HR 80 | Ht 64.0 in | Wt 247.8 lb

## 2022-01-12 DIAGNOSIS — R7303 Prediabetes: Secondary | ICD-10-CM

## 2022-01-12 DIAGNOSIS — Z87898 Personal history of other specified conditions: Secondary | ICD-10-CM | POA: Diagnosis not present

## 2022-01-12 LAB — POCT GLYCOSYLATED HEMOGLOBIN (HGB A1C): Hemoglobin A1C: 5.6 % (ref 4.0–5.6)

## 2022-01-12 MED ORDER — METFORMIN HCL ER 500 MG PO TB24: 1000.0000 mg | ORAL_TABLET | Freq: Every day | ORAL | 3 refills | Status: DC

## 2022-01-12 NOTE — Assessment & Plan Note (Signed)
A1c today is 5.6 down from 5.7 at her last visit.  Congratulated patient on the success that she has had with her dietary changes.  She would like to meet with our nutritionist here in clinic to pursue further dietary changes and weight loss.  -Increase metformin XR to 1000 mg daily -Will connect patient with Dr. Jenne Campus for nutrition consult -We will discuss case with pharmacy team to see if there is any way to get her GLP-1 injectables at an affordable price

## 2022-01-12 NOTE — Patient Instructions (Signed)
Ms. Emma Cantu, It is such a joy to take care you! Thank you for coming in today.   As a reminder, here is a recap of what we talked about today:  -First and foremost, congratulations!  You have lowered your weight and your A1c through your diet changes.  That is a big deal. -I am refilling your metformin at a higher dose.  Lets start with 1000 mg each morning and see how you do with that. -I would love for you to get in to visit our nutritionist, Dr. Gerilyn Pilgrim.  Unfortunately, she is out of the [place] right now, but I am giving you her card.  I would love you to reach out to her and come see her. -I will also reach out to our pharmacy team to see if there is any way that we could get the injectable weight loss medications covered for you.   Take care and seek immediate care sooner if you develop any concerns.   Eliezer Mccoy, MD [facility]

## 2022-01-12 NOTE — Progress Notes (Deleted)
° ° °  SUBJECTIVE:   CHIEF COMPLAINT / HPI:   Metabolic syndrome: Patient has class III obesity,  LDL in 8/22 was 190, has had fasting hyperglycemia (3 times in 2 years), HDL <40. Patient had trialed metformin with ***results.  Pre-diabetes: last A1c 8/22 showed 5.7%, up from 5.4%.  How is breast tenderness?  PERTINENT  PMH / PSH: ***  OBJECTIVE:   There were no vitals taken for this visit.  ***  ASSESSMENT/PLAN:   No problem-specific Assessment & Plan notes found for this encounter.     Gladys Damme, MD [place]

## 2022-01-12 NOTE — Progress Notes (Signed)
° ° °  SUBJECTIVE:   CHIEF COMPLAINT / HPI:   Prediabetes   Class III Obesity Patient had a new diagnosis of prediabetes at her last visit with her PCP.  Since that time, she had a trial of metformin 500 mg daily and has also instituted lifestyle changes including cutting back on soda, portion sizes, and cutting fast food out of her diet.  She is down 5 pounds since her last visit and is pleased with the success that she has made.  However, she would like to pursue medication assistance for her weight loss as previously discussed with Dr. Leary Roca.  Unfortunately, her insurance will not cover GLP-1 injectables without a diagnosis of diabetes and she would be unable to afford cash-pay price.    OBJECTIVE:   BP 118/85    Pulse 80    Ht 5\' 4"  (1.626 m)    Wt 247 lb 12.8 oz (112.4 kg)    SpO2 100%    BMI 42.53 kg/m   Physical Exam Constitutional:      General: She is not in acute distress.    Appearance: She is obese.  Cardiovascular:     Rate and Rhythm: Normal rate and regular rhythm.  Pulmonary:     Effort: Pulmonary effort is normal. No respiratory distress.     Breath sounds: Normal breath sounds.  Abdominal:     Tenderness: There is no abdominal tenderness.     ASSESSMENT/PLAN:   Class III Obesity History of prediabetes A1c today is 5.6 down from 5.7 at her last visit.  Congratulated patient on the success that she has had with her dietary changes.  She would like to meet with our nutritionist here in clinic to pursue further dietary changes and weight loss.  -Increase metformin XR to 1000 mg daily -Will connect patient with Dr. for nutrition consult -We will discuss case with pharmacy team to see if there is any way to get her GLP-1 injectables at an affordable price     Gerilyn Pilgrim, MD [facility]

## 2022-02-20 DIAGNOSIS — Z20822 Contact with and (suspected) exposure to covid-19: Secondary | ICD-10-CM | POA: Diagnosis not present

## 2022-02-20 DIAGNOSIS — J020 Streptococcal pharyngitis: Secondary | ICD-10-CM | POA: Diagnosis not present

## 2022-03-12 DIAGNOSIS — J020 Streptococcal pharyngitis: Secondary | ICD-10-CM | POA: Diagnosis not present

## 2022-03-12 DIAGNOSIS — Z20822 Contact with and (suspected) exposure to covid-19: Secondary | ICD-10-CM | POA: Diagnosis not present

## 2022-05-09 ENCOUNTER — Other Ambulatory Visit (HOSPITAL_COMMUNITY)
Admission: RE | Admit: 2022-05-09 | Discharge: 2022-05-09 | Disposition: A | Discharge status: Home / Self Care | Payer: Medicaid Other | Source: Ambulatory Visit | Attending: Family Medicine | Admitting: Family Medicine

## 2022-05-09 ENCOUNTER — Ambulatory Visit (INDEPENDENT_AMBULATORY_CARE_PROVIDER_SITE_OTHER): Payer: Medicaid Other | Admitting: Family Medicine

## 2022-05-09 VITALS — BP 118/79 | HR 97 | Ht 64.0 in | Wt 248.2 lb

## 2022-05-09 DIAGNOSIS — Z7689 Persons encountering health services in other specified circumstances: Secondary | ICD-10-CM | POA: Diagnosis not present

## 2022-05-09 DIAGNOSIS — N898 Other specified noninflammatory disorders of vagina: Secondary | ICD-10-CM | POA: Diagnosis not present

## 2022-05-09 LAB — POCT WET PREP (WET MOUNT)
Clue Cells Wet Prep Whiff POC: NEGATIVE
Trichomonas Wet Prep HPF POC: ABSENT

## 2022-05-09 NOTE — Patient Instructions (Signed)
It was great seeing you today! ? ?Today we performed full testing on sexually transmitted infections, this included blood work as well testing for syphilis and HIV. I will let you know of any abnormal results. Please make sure to use protection with each encounter.  ? ?Please follow up at your next scheduled appointment, if anything arises between now and then, please don't hesitate to contact our office. ? ? ?Thank you for allowing [place] to be a part of your medical care! ? ?Thank you, ?Dr. Robyne Peers  ?

## 2022-05-09 NOTE — Progress Notes (Signed)
? ? ?  SUBJECTIVE:  ? ?CHIEF COMPLAINT / HPI:  ? ?Patient presents requesting full STI check, recently has a new partner so she wants to be cautious. Denies fever, chills, abdominal pain, pelvic pain, vaginal discharge and dysuria. Has IUD in place.  ? ?OBJECTIVE:  ? ?BP 118/79   Pulse 97   Ht 5\' 4"  (1.626 m)   Wt 248 lb 3.2 oz (112.6 kg)   SpO2 99%   BMI 42.60 kg/m?   ?General: Patient well-appearing, in no acute distress. ?Resp: normal work of breathing ?GU: normal labia and vulva, no rashes or external lesions noted, no vaginal discharge or bleeding noted ? ?GU exam performed in the presence of chaperone, . ? ?ASSESSMENT/PLAN:  ? ?Encounter for assessment of STD exposure ?-patient agreeable to blood work so GC/Chlamydia, wet prep, RPR and HIV pending. Will inform patient of results. ?-safe sex counseling provided ?-up to date on PAP, instructed her to return as her next PAP is due in October 2023 ?  ?-PHQ-9 score of 4 with negative question 9 reviewed.  ? ? ?November 2023, DO ?[facility]  ?

## 2022-05-09 NOTE — Assessment & Plan Note (Signed)
-  patient agreeable to blood work so GC/Chlamydia, wet prep, RPR and HIV pending. Will inform patient of results. ?-safe sex counseling provided ?-up to date on PAP, instructed her to return as her next PAP is due in October 2023 ?

## 2022-05-10 ENCOUNTER — Other Ambulatory Visit: Payer: Self-pay | Admitting: Family Medicine

## 2022-05-10 DIAGNOSIS — B3731 Acute candidiasis of vulva and vagina: Secondary | ICD-10-CM

## 2022-05-10 LAB — CERVICOVAGINAL ANCILLARY ONLY
Chlamydia: NEGATIVE
Comment: NEGATIVE
Comment: NEGATIVE
Comment: NORMAL
Neisseria Gonorrhea: NEGATIVE
Trichomonas: NEGATIVE

## 2022-05-10 LAB — HIV ANTIBODY (ROUTINE TESTING W REFLEX): HIV Screen 4th Generation wRfx: NONREACTIVE

## 2022-05-10 LAB — RPR: RPR Ser Ql: NONREACTIVE

## 2022-05-10 MED ORDER — FLUCONAZOLE 150 MG PO TABS: 150.0000 mg | ORAL_TABLET | Freq: Once | ORAL | 0 refills | Status: AC

## 2022-05-12 ENCOUNTER — Ambulatory Visit (INDEPENDENT_AMBULATORY_CARE_PROVIDER_SITE_OTHER): Payer: Medicaid Other | Admitting: Family Medicine

## 2022-05-12 ENCOUNTER — Telehealth: Payer: Self-pay | Admitting: *Deleted

## 2022-05-12 VITALS — BP 127/81 | HR 86 | Wt 249.0 lb

## 2022-05-12 DIAGNOSIS — Z131 Encounter for screening for diabetes mellitus: Secondary | ICD-10-CM

## 2022-05-12 DIAGNOSIS — B3731 Acute candidiasis of vulva and vagina: Secondary | ICD-10-CM

## 2022-05-12 LAB — POCT GLYCOSYLATED HEMOGLOBIN (HGB A1C): Hemoglobin A1C: 5.5 % (ref 4.0–5.6)

## 2022-05-12 MED ORDER — MICONAZOLE NITRATE 2 % EX CREA: 1.0000 "application " | TOPICAL_CREAM | Freq: Two times a day (BID) | CUTANEOUS | 0 refills | Status: DC

## 2022-05-12 MED ORDER — FLUCONAZOLE 150 MG PO TABS: ORAL_TABLET | ORAL | 0 refills | Status: DC

## 2022-05-12 NOTE — Progress Notes (Signed)
    SUBJECTIVE:   CHIEF COMPLAINT / HPI:   26 yo patient with wet prep + for KOH and yeast on 5/15 returns today with continued symptoms after 1 dose of fluconazole 150 mg. GC, HIV, and RPR from 5/15 were negative. Patient reports that she took the diflucan on Tuesday night. She has continued to have thick, white discharge, cottage cheese-like. Also has pruritus and erythema on her vulva. Will check A1c for good measure.  PERTINENT  PMH / PSH: non-contributory  OBJECTIVE:   BP 127/81   Pulse 86   Wt 249 lb (112.9 kg)   SpO2 98%   BMI 42.74 kg/m   Nursing note and vitals reviewed GEN: age-appropriate, AAW, resting comfortably in chair, NAD, class III obesity HEENT: NCAT. PERRLA. Sclera without injection or icterus. MMM.   Neuro: AOx3  Ext: no edema Psych: Pleasant and appropriate  ASSESSMENT/PLAN:   Vaginal candidiasis Patient with continued vaginal candidiasis, did not respond to 1 dose of diflucan. Recommend CDC guideline dosage of 150 mg of diflucan every three days for total of 3 doses, rx given. Also due to pruritus, will recommend external miconazole cr for symptoms of vulva, BID x 7 days or 48 hours after clear. If no improvement, will need aptima swab to speciate yeast, could be candida glabrata. Can also consider mycoplasma or ureaplasma.     Gladys Damme, MD [place]

## 2022-05-12 NOTE — Assessment & Plan Note (Signed)
Patient with continued vaginal candidiasis, did not respond to 1 dose of diflucan. Recommend CDC guideline dosage of 150 mg of diflucan every three days for total of 3 doses, rx given. Also due to pruritus, will recommend external miconazole cr for symptoms of vulva, BID x 7 days or 48 hours after clear. If no improvement, will need aptima swab to speciate yeast, could be candida glabrata. Can also consider mycoplasma or ureaplasma.

## 2022-05-12 NOTE — Patient Instructions (Addendum)
It was a pleasure to see you today!  For the yeast infection: I recommend treating with two more doses of diflucan- once tomorrow on May 19th, and then again 3 days later on Monday, May 22nd. Also use miconazole cream 2% externally on the rash twice a day for 7 days or until clear for 48 hours. If no improvement after these two treatments, return for testing. Your A1c was 5.5%, totally normal!  Be Well,  Dr. Leary Roca

## 2022-05-12 NOTE — Telephone Encounter (Signed)
Pt reports that since taking diflucan she is "more itchy"and the consistency of her discharge has changed.  She is agreeable to appt, but would like to ask Dr. Robyne Peers if something can be called in to avoid the OV if possible.  To Dr. Robyne Peers.  You can respond back to me and I will call her. Emma Cantu, CMA

## 2022-05-31 ENCOUNTER — Encounter: Payer: Self-pay | Admitting: *Deleted

## 2022-06-02 ENCOUNTER — Encounter: Payer: Self-pay | Admitting: Family Medicine

## 2022-06-02 ENCOUNTER — Other Ambulatory Visit: Payer: Self-pay

## 2022-06-02 ENCOUNTER — Ambulatory Visit (INDEPENDENT_AMBULATORY_CARE_PROVIDER_SITE_OTHER): Payer: Medicaid Other | Admitting: Family Medicine

## 2022-06-02 VITALS — BP 127/81 | HR 93 | Wt 254.0 lb

## 2022-06-02 DIAGNOSIS — N898 Other specified noninflammatory disorders of vagina: Secondary | ICD-10-CM

## 2022-06-02 DIAGNOSIS — B379 Candidiasis, unspecified: Secondary | ICD-10-CM

## 2022-06-02 DIAGNOSIS — R300 Dysuria: Secondary | ICD-10-CM

## 2022-06-02 LAB — POCT URINALYSIS DIP (MANUAL ENTRY)
Bilirubin, UA: NEGATIVE
Glucose, UA: NEGATIVE mg/dL
Ketones, POC UA: NEGATIVE mg/dL
Nitrite, UA: NEGATIVE
Protein Ur, POC: NEGATIVE mg/dL
Spec Grav, UA: 1.025 (ref 1.010–1.025)
Urobilinogen, UA: 0.2 E.U./dL
pH, UA: 6 (ref 5.0–8.0)

## 2022-06-02 LAB — POCT UA - MICROSCOPIC ONLY

## 2022-06-02 NOTE — Patient Instructions (Addendum)
It was wonderful to see you today.  Please bring ALL of your medications with you to every visit.   Today we talked about:  -Recurrent symptoms of yeast infection and possible urinary tract infection.  I will notify you when results are available.  Please be sure to schedule follow up at the front  desk before you leave today.   Please call the clinic at [phone] if your symptoms worsen or you have any concerns. It was our pleasure to serve you.  Dr. Salvadore Dom

## 2022-06-02 NOTE — Progress Notes (Signed)
    SUBJECTIVE:   CHIEF COMPLAINT / HPI:   Recurrent yeast infection symptoms Originally seen 5/15 and treated with diflucan x1. Symptoms persisted and was treated with diflucan x3 and vaginal cream. Has a history of prediabetes and A1c was checked at the time which was not elevated from the last value. She is experiencing vaginal pruritis, discharge, and dysuria. Denies fever and back pain. Declines STD testing today. Has resumed sexual activity and using condoms. Has changed out under garments.   PERTINENT  PMH / PSH: As above.   OBJECTIVE:   BP 127/81   Pulse 93   Wt 254 lb (115.2 kg)   SpO2 97%   BMI 43.60 kg/m   Results for orders placed or performed in visit on 06/02/22 (from the past 24 hour(s))  POCT urinalysis dipstick     Status: Abnormal   Collection Time: 06/02/22  3:00 PM  Result Value Ref Range   Color, UA yellow yellow   Clarity, UA cloudy (A) clear   Glucose, UA negative negative mg/dL   Bilirubin, UA negative negative   Ketones, POC UA negative negative mg/dL   Spec Grav, UA 6.701 4.103 - 1.025   Blood, UA trace-intact (A) negative   pH, UA 6.0 5.0 - 8.0   Protein Ur, POC negative negative mg/dL   Urobilinogen, UA 0.2 0.2 or 1.0 E.U./dL   Nitrite, UA Negative Negative   Leukocytes, UA Small (1+) (A) Negative  POCT UA - Microscopic Only     Status: None   Collection Time: 06/02/22  3:00 PM  Result Value Ref Range   WBC, Ur, HPF, POC 0-3 0 - 5   RBC, Urine, Miroscopic 0-2 0 - 2   Bacteria, U Microscopic TRACE None - Trace   Mucus, UA FEW    Epithelial cells, urine per micros 5-10   General: Appears well, no acute distress. Age appropriate. Respiratory: normal effort Pelvic exam: VULVA: normal appearing vulva with no masses, tenderness or lesions, VAGINA: vaginal discharge - white and milky, CERVIX: normal appearing cervix without discharge or lesions, exam chaperoned by Maree Erie, CMA. Neuro: alert and oriented Psych: normal  affect  ASSESSMENT/PLAN:   Vaginal discharge, Yeast infection Recurrent symptoms despite adequate treatment x2. Will speciate pelvic swab today. Discussed refraining from tight fitting bottoms and using cotton underwear.  - Candida 6 Species Profile, NAA  Dysuria Concern for possible UTI symptoms. UA unremarkable. Consider urine culture if persisent or systemic symptoms arise.   Lavonda Jumbo, DO [facility]

## 2022-06-08 ENCOUNTER — Telehealth: Payer: Self-pay | Admitting: Family Medicine

## 2022-06-08 DIAGNOSIS — B3731 Acute candidiasis of vulva and vagina: Secondary | ICD-10-CM

## 2022-06-08 LAB — CANDIDA 6 SPECIES PROFILE, NAA
C PARAPSILOSIS/TROPICALIS: NEGATIVE
Candida albicans, NAA: POSITIVE — AB
Candida glabrata, NAA: NEGATIVE
Candida krusei, NAA: NEGATIVE
Candida lusitaniae, NAA: NEGATIVE

## 2022-06-08 MED ORDER — MICONAZOLE NITRATE 2 % VA CREA: 1.0000 | TOPICAL_CREAM | Freq: Every day | VAGINAL | 0 refills | Status: AC

## 2022-06-08 NOTE — Telephone Encounter (Signed)
Patient informed of results.  Has not tried vaginal insert.  We will send to pharmacy.  Patient voiced understanding.  Emma Vath Autry-Lott, DO 06/08/2022, 4:55 PM PGY-3, [place]

## 2022-06-10 NOTE — Telephone Encounter (Signed)
Patient returns call to nurse line. Patient has questions on medication management. Patient thought that she would be taking the vaginal insert and diflucan.   Please advise.   Veronda Prude, RN

## 2022-06-16 NOTE — Telephone Encounter (Signed)
Called patient. Patient did not answer. Left VM for patient to return call to office to discuss below.   Veronda Prude, RN

## 2022-06-30 ENCOUNTER — Telehealth: Payer: Self-pay

## 2022-06-30 NOTE — Telephone Encounter (Signed)
Patient calls nurse line reporting continued vaginal discharge.   Patient reports she did a course of OTC medication x7 days with minimal relief.   Patients reports thick itchy white discharge. Patient denies any abnormal odors or UTI symptoms.   Patient scheduled for tomorrow for evaluation.

## 2022-07-01 ENCOUNTER — Encounter: Payer: Self-pay | Admitting: Family Medicine

## 2022-07-01 ENCOUNTER — Other Ambulatory Visit (HOSPITAL_COMMUNITY)
Admission: RE | Admit: 2022-07-01 | Discharge: 2022-07-01 | Disposition: A | Discharge status: Home / Self Care | Payer: Medicaid Other | Source: Ambulatory Visit | Attending: Family Medicine | Admitting: Family Medicine

## 2022-07-01 ENCOUNTER — Ambulatory Visit (INDEPENDENT_AMBULATORY_CARE_PROVIDER_SITE_OTHER): Payer: Medicaid Other | Admitting: Family Medicine

## 2022-07-01 VITALS — BP 110/80 | HR 79 | Ht 64.0 in | Wt 256.0 lb

## 2022-07-01 DIAGNOSIS — R399 Unspecified symptoms and signs involving the genitourinary system: Secondary | ICD-10-CM

## 2022-07-01 DIAGNOSIS — B3731 Acute candidiasis of vulva and vagina: Secondary | ICD-10-CM

## 2022-07-01 DIAGNOSIS — Z87898 Personal history of other specified conditions: Secondary | ICD-10-CM | POA: Diagnosis not present

## 2022-07-01 DIAGNOSIS — R350 Frequency of micturition: Secondary | ICD-10-CM

## 2022-07-01 LAB — POCT URINALYSIS DIP (MANUAL ENTRY)
Bilirubin, UA: NEGATIVE
Blood, UA: NEGATIVE
Glucose, UA: NEGATIVE mg/dL
Ketones, POC UA: NEGATIVE mg/dL
Nitrite, UA: NEGATIVE
Protein Ur, POC: NEGATIVE mg/dL
Spec Grav, UA: >=1.03 — AB (ref 1.010–1.025)
Urobilinogen, UA: 1 E.U./dL
pH, UA: 6 (ref 5.0–8.0)

## 2022-07-01 LAB — POCT URINE PREGNANCY: Preg Test, Ur: NEGATIVE

## 2022-07-01 MED ORDER — FLUCONAZOLE 150 MG PO TABS: 150.0000 mg | ORAL_TABLET | ORAL | 0 refills | Status: AC

## 2022-07-01 NOTE — Patient Instructions (Signed)
It was wonderful to see you today.  Please bring ALL of your medications with you to every visit.   Today we talked about:  We completed a pelvic exam today and collected a swab to test for STDs and yeast. You'll be sent home with 2 doses of Diflucan to treat this until we have the results of your tests. You also had a negative pregnancy test, and your urinalysis showed a small amount of white blood cells which is likely related to your yeast infection. We will call you if the results of your urine culture are abnormal.  Please follow up as needed.   Thank you for choosing [facility].   Please call [phone] with any questions about today's appointment.  Please be sure to schedule follow up at the front  desk before you leave today.   Vonna Drafts, MD  Family Medicine

## 2022-07-01 NOTE — Assessment & Plan Note (Signed)
-  Diflucan 150mg  q72hrs, 2 doses given -candida 6 species test -swabbed for yeast, g/c, trich, BV -will call pt with updated treatment recs once results return

## 2022-07-01 NOTE — Assessment & Plan Note (Signed)
-  UA with small amount of leukocytes -sent urine culture, will f/u if abnormal

## 2022-07-01 NOTE — Progress Notes (Cosign Needed Addendum)
    SUBJECTIVE:   CHIEF COMPLAINT / HPI:   Pt presents today due to concern for yeast infection. She has had recurrent yeast infections and recently tried Diflucan and miconazole cream which helped briefly but her symptoms returned after only 1 week of clearance. Today, she is having odorless and painless thick white vaginal discharge. She has one female sexual partner, her boyfriend, and they use condoms. She is unsure whether the condoms have spermicide. She also has an IUD for contraception. She denies any abdominal or pelvic pain. She reports some discomfort while urinating but no burning or discoloration.  During the visit today, we also discussed her medication list. She reports that she is not taking her metformin very often. She was also asked about discontinuing her Crestor use but she said she preferred to stay on it.  PERTINENT  PMH / PSH: reviewed and updated Prediabetes  OBJECTIVE:   BP 110/80   Pulse 79   Ht 5\' 4"  (1.626 m)   Wt 256 lb (116.1 kg)   SpO2 98%   BMI 43.94 kg/m    GU Exam:    External exam: Normal-appearing female external genitalia.  Vaginal exam notable for thick, white discharge and erythematous vaginal mucosa.  Bimanual exam reveals normal sized uterus no masses appreciated, no pain with examination.  Chaperoned examine, CMA .   ASSESSMENT/PLAN:   Vaginal candidiasis -Diflucan 150mg  q72hrs, 2 doses given -candida 6 species test -swabbed for yeast, g/c, trich, BV -will call pt with updated treatment recs once results return   Lower urinary tract symptoms -UA with small amount of leukocytes -sent urine culture, will f/u if abnormal   History of prediabetes -Recommend discontinuing Crestor -Encouraged metformin use     Scientist, research (life sciences), MD [facility]

## 2022-07-01 NOTE — Assessment & Plan Note (Signed)
-  Recommend discontinuing Crestor -Encouraged metformin use

## 2022-07-03 LAB — URINE CULTURE

## 2022-07-04 ENCOUNTER — Telehealth: Payer: Self-pay | Admitting: Family Medicine

## 2022-07-04 LAB — CERVICOVAGINAL ANCILLARY ONLY
Bacterial Vaginitis (gardnerella): NEGATIVE
Candida Glabrata: NEGATIVE
Candida Vaginitis: POSITIVE — AB
Chlamydia: NEGATIVE
Comment: NEGATIVE
Comment: NEGATIVE
Comment: NEGATIVE
Comment: NEGATIVE
Comment: NEGATIVE
Comment: NORMAL
Neisseria Gonorrhea: NEGATIVE
Trichomonas: NEGATIVE

## 2022-07-04 NOTE — Telephone Encounter (Signed)
Spoke with patient and informed her that she has a yeast infection but tested negative for other STIs. Also informed her that her urine culture was not impressive. She states she took the last dose of her diflucan today and her symptoms have improved a little, though still present. Will update her once we have the 6 species profile.

## 2022-11-07 ENCOUNTER — Other Ambulatory Visit: Payer: Self-pay

## 2022-11-07 DIAGNOSIS — A6000 Herpesviral infection of urogenital system, unspecified: Secondary | ICD-10-CM

## 2022-11-07 MED ORDER — VALACYCLOVIR HCL 500 MG PO TABS: 500.0000 mg | ORAL_TABLET | Freq: Every day | ORAL | 2 refills | Status: DC

## 2022-11-07 NOTE — Telephone Encounter (Signed)
Patient calls nurse line requesting a refill on Valtrex.   Patient reports an active breakout.  Will forward to PCP.

## 2022-12-01 ENCOUNTER — Ambulatory Visit (INDEPENDENT_AMBULATORY_CARE_PROVIDER_SITE_OTHER): Payer: Medicaid Other | Admitting: Student

## 2022-12-01 ENCOUNTER — Other Ambulatory Visit (HOSPITAL_COMMUNITY)
Admission: RE | Admit: 2022-12-01 | Discharge: 2022-12-01 | Disposition: A | Discharge status: Home / Self Care | Payer: Medicaid Other | Source: Ambulatory Visit | Attending: Family Medicine | Admitting: Family Medicine

## 2022-12-01 ENCOUNTER — Encounter: Payer: Self-pay | Admitting: Student

## 2022-12-01 VITALS — BP 128/94 | HR 82 | Ht 64.0 in | Wt 255.1 lb

## 2022-12-01 DIAGNOSIS — Z124 Encounter for screening for malignant neoplasm of cervix: Secondary | ICD-10-CM | POA: Diagnosis not present

## 2022-12-01 NOTE — Patient Instructions (Signed)
It was great to see you! Thank you for allowing me to participate in your care!   I recommend that you always bring your medications to each appointment as this makes it easy to ensure we are on the correct medications and helps [place] not miss when refills are needed.  Our plans for today:  - We will call you with the results of your pap smear - Follow-up as needed  Take care and seek immediate care sooner if you develop any concerns. Please remember to show up 15 minutes before your scheduled appointment time!  Tiffany Kocher, DO [facility]

## 2022-12-01 NOTE — Progress Notes (Signed)
    SUBJECTIVE:   CHIEF COMPLAINT / HPI: Obtain pap smear  Pap Smear: Patient is a 26 y.o. female presenting for pap smear.  She states she does not have any discharge, odor or vaginal symptosm.  She is not  interested in screening for sexually transmitted infections today. She is on contraception with IUD.  PERTINENT  PMH / PSH: Vaginal candiais   OBJECTIVE:   BP (!) 128/94   Pulse 82   Ht 5\' 4"  (1.626 m)   Wt 255 lb 2 oz (115.7 kg)   SpO2 98%   BMI 43.79 kg/m    General: NAD, pleasant, able to participate in exam Respiratory: Normal effort, no obvious respiratory distress Pelvic: VULVA: normal appearing vulva with no masses, tenderness or lesions, VAGINA: Normal appearing vagina with normal color, no lesions, with scant discharge present. CERVIX: No lesions, scant discharge present. Pap smear performed with cytobrush and spatula  Chaperone Tashira CMA present for pelvic exam  ASSESSMENT/PLAN:   No problem-specific Assessment & Plan notes found for this encounter.  Assessment:  26 y.o. female here for pap smear. No discharge present.  Patient is not interested in STI screening.   Plan: -F/u pap smear results -Follow-up as needed  26, DO [facility] Medicine Center

## 2022-12-06 LAB — CYTOLOGY - PAP: Diagnosis: NEGATIVE

## 2022-12-15 ENCOUNTER — Ambulatory Visit (INDEPENDENT_AMBULATORY_CARE_PROVIDER_SITE_OTHER): Payer: Medicaid Other | Admitting: Student

## 2022-12-15 ENCOUNTER — Encounter: Payer: Self-pay | Admitting: Student

## 2022-12-15 VITALS — BP 123/80 | HR 85 | Ht 64.0 in | Wt 255.5 lb

## 2022-12-15 DIAGNOSIS — Z6841 Body Mass Index (BMI) 40.0 and over, adult: Secondary | ICD-10-CM

## 2022-12-15 NOTE — Progress Notes (Signed)
    SUBJECTIVE:   CHIEF COMPLAINT / HPI:   Elevated BPWeight loss Patient has elevated blood pressure readings last 2 visits. Today 129/91, recheck was 123/80. Discussed medication options as well as life-style changes. Patient is also wanting to lose weight and would like to discuss options. Skipping breakfast and lunch very often. No exercise. Low protein, high carb/fat diet. Due to [employer], it would be difficult to approve Ozempic/Mounjaro for weight loss- only prediabetes at this time.  PERTINENT  PMH / PSH: Prediabetes, BMI 43.86  OBJECTIVE:   BP 123/80   Pulse 85   Ht _0  (1.626 m)   Wt 255 lb 8 oz (115.9 kg)   SpO2 100%   BMI 43.86 kg/m    General: NAD, well-appearing, well-nourished Respiratory: No respiratory distress, breathing comfortably, able to speak in full sentences Skin: warm and dry, no rashes noted on exposed skin Psych: Appropriate affect and mood  ASSESSMENT/PLAN:   Class 3 severe obesity with body mass index (BMI) of 40.0 to 44.9 in adult [facility]) Will start life-style change. Will start with 1 SMART goal per visit and optional bonus goal. Additionally provided general nutrition guidance, including increased protein (1g/10calorie) for meals. - Goal 1: Eat breakfast, lunch, and dinner everyday for the next 6 weeks. Does not have to be a full meal, could be meal substitute (shake, protein bar). Should have 1g protein for every 10 calories.  - Bonus goal: Walk for 15 min after work 3 days a week - Follow-up in 4-6 weeks  Elevated BP Mildly elevated BP, that was normal on recheck. Will use life-style modification as above.  Follow-up recommendations for next visit 1.) If goal met, work on exercise goal 2.) If both goals met, increase exercise goal 3.) Consider restarting metformin  Leslie Dales, [place]

## 2022-12-15 NOTE — Patient Instructions (Signed)
It was great to see you! Thank you for allowing me to participate in your care!   I recommend that you always bring your medications to each appointment as this makes it easy to ensure we are on the correct medications and helps [place] not miss when refills are needed.  Our plans for today:  - Please eat breakfast, lunch and dinner every day for the next 6 weeks. For every 10 calories in a meal, try to get 1 gram of protein. For example: If a protein bar has 120 calories, it should also have 12 grams of protein. For every meal try to increase the amount of protein, and reduce the amount of carbs and fats. Do not count calories at this time. - Bonus goal: Walk for 15 minutes 3 days a week after work - Follow-up in 4-6 weeks to discuss weight loss   Take care and seek immediate care sooner if you develop any concerns. Please remember to show up 15 minutes before your scheduled appointment time!  Tiffany Kocher, DO [facility]

## 2022-12-15 NOTE — Assessment & Plan Note (Signed)
Will start life-style change. Will start with 1 SMART goal per visit and optional bonus goal. Additionally provided general nutrition guidance, including increased protein (1g/10calorie) for meals. - Goal 1: Eat breakfast, lunch, and dinner everyday for the next 6 weeks. Does not have to be a full meal, could be meal substitute (shake, protein bar). Should have 1g protein for every 10 calories.  - Bonus goal: Walk for 15 min after work 3 days a week - Follow-up in 4-6 weeks

## 2022-12-15 NOTE — Progress Notes (Deleted)
    SUBJECTIVE:   CHIEF COMPLAINT / HPI:   ***  PERTINENT  PMH / PSH: ***  OBJECTIVE:   BP (!) 129/91   Pulse 85   Ht 5\' 4"  (1.626 m)   Wt 255 lb 8 oz (115.9 kg)   SpO2 100%   BMI 43.86 kg/m   ***  ASSESSMENT/PLAN:   No problem-specific Assessment & Plan notes found for this encounter.     , DO [facility] Medicine Center

## 2023-03-27 ENCOUNTER — Telehealth: Payer: Self-pay

## 2023-03-27 NOTE — Telephone Encounter (Signed)
Patient calls nurse line requesting to schedule appointment for fatigue and chest pain. Reports that symptoms have been intermittent for the last month. She states that pain is sharp and stabbing.   Denies current chest pain, arm pain or SHOB.   She reports that she did have episode of severe right sided arm pain prior to issues with chest pain. She states that this has now completley resolved.   Scheduled patient for evaluation tomorrow afternoon.   ED precautions discussed.   Talbot Grumbling, RN

## 2023-03-28 ENCOUNTER — Ambulatory Visit (INDEPENDENT_AMBULATORY_CARE_PROVIDER_SITE_OTHER): Payer: Medicaid Other | Admitting: Family Medicine

## 2023-03-28 ENCOUNTER — Ambulatory Visit (HOSPITAL_COMMUNITY)
Admission: RE | Admit: 2023-03-28 | Discharge: 2023-03-28 | Disposition: A | Discharge status: Home / Self Care | Payer: Medicaid Other | Source: Ambulatory Visit | Attending: Family Medicine | Admitting: Family Medicine

## 2023-03-28 VITALS — BP 128/82 | HR 84 | Ht 64.0 in | Wt 256.0 lb

## 2023-03-28 DIAGNOSIS — Z6841 Body Mass Index (BMI) 40.0 and over, adult: Secondary | ICD-10-CM

## 2023-03-28 DIAGNOSIS — R5383 Other fatigue: Secondary | ICD-10-CM

## 2023-03-28 DIAGNOSIS — R079 Chest pain, unspecified: Secondary | ICD-10-CM

## 2023-03-28 DIAGNOSIS — Z87898 Personal history of other specified conditions: Secondary | ICD-10-CM | POA: Diagnosis not present

## 2023-03-28 LAB — POCT GLYCOSYLATED HEMOGLOBIN (HGB A1C): HbA1c, POC (prediabetic range): 5.5 % — AB (ref 5.7–6.4)

## 2023-03-28 NOTE — Progress Notes (Signed)
    SUBJECTIVE:   CHIEF COMPLAINT / HPI:   Patient presents with chest pain occurring intermittently for the past month. States sometimes happens daily and then can go a few days without feeling it. Has happened in the past but not as frequent. Describes it as a sharp pain at different places around chest, typically around center and left side. Does feel like she occasional feels a skip beat. Does endorse anxiety about her health. Worries about getting cancer.   Additionally she endorses feeling fatigue. Gets up at 5:45 am, goes to sleep around 8:30/9pm Does snore very loudly. Has been told she stops breathing in her sleep. Feels tired despite getting a good amount of sleep at night.   Prediabetes: Not taking Metformin.    PERTINENT  PMH / PSH: Reviewed   OBJECTIVE:   BP 128/82   Pulse 84   Ht 5\' 4"  (1.626 m)   Wt 256 lb (116.1 kg)   SpO2 98%   BMI 43.94 kg/m   Physical exam General: well appearing, NAD Cardiovascular: RRR, no murmurs Lungs: CTAB. Normal WOB Abdomen: soft, non-distended, non-tender Skin: warm, dry. No edema  ASSESSMENT/PLAN:   Chest pain Patient presents with sharp intermittent chest pain that lasts several seconds and has been worsening over the last month. EKG showing NSR without signs of ischemia.  Low suspicion for cardiac etiology. 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 Suspect anxiety may be contributing as well (GAD 7 score of 14). Will follow up in 1-2 weeks to check on symptoms/ discuss anxiety and possible treatment. Return precautions discussed.  Fatigue Sleep hygiene discussed and though she gets adequate amount of sleep does not feel well rested. STOP-BANG score of 4 indicating intermediate risk for moderate to severe OSA. Referral placed for sleep study. Will also check TSH, CBC for anemia.   Class 3 severe obesity with body mass index (BMI) of 40.0 to 44.9 in adult (HCC) BMI 43.94. Likely contributing to fatigue as well. Discussed importance of  weight loss. Will check lipid panel, insulin levels for resistance. Will try starting Mounjaro 2.5 mg weekly. Continue lifestyle changes with diet and exercise     Return to discuss anxiety and weight loss   Emma Cantu

## 2023-03-28 NOTE — Patient Instructions (Addendum)
It was great seeing you today!  You came in for chest pain and we checked an EKG which was normal.  It may be something called Precordial catch syndrome which is benign. Anxiety could also be playing a role. If pain were to worsen, radiate down your arm, up to your jaw or with any other new and concerning symptoms please let us know.   We are checking blood work that could be contributing to your fatigue. I will call you if anything is abnormal or will send a mychart message if normal. I have also referred you to Neurology to perform a sleep study. They will call you in the next 1-2 weeks for a sleep study  Based on your labs I will see if I can prescribe a GLP 1 for weight loss.   Please check-out at the front desk before leaving the clinic. Schedule to return in about 1-2 weeks to follow up and discuss results, but if you need to be seen earlier than that for any new issues we're happy to fit you in, just give [place] a call!   Feel free to call with any questions or concerns at any time, at [phone].   Take care,  Dr. Shary Key [facility] Medicine Center

## 2023-03-29 LAB — CBC
Hematocrit: 40.8 % (ref 34.0–46.6)
Hemoglobin: 13.6 g/dL (ref 11.1–15.9)
MCH: 29.6 pg (ref 26.6–33.0)
MCHC: 33.3 g/dL (ref 31.5–35.7)
MCV: 89 fL (ref 79–97)
Platelets: 267 10*3/uL (ref 150–450)
RBC: 4.6 x10E6/uL (ref 3.77–5.28)
RDW: 13.5 % (ref 11.7–15.4)
WBC: 11.2 10*3/uL — ABNORMAL HIGH (ref 3.4–10.8)

## 2023-03-29 LAB — INSULIN, RANDOM: INSULIN: 37.4 u[IU]/mL — ABNORMAL HIGH (ref 2.6–24.9)

## 2023-03-29 LAB — LIPID PANEL
Chol/HDL Ratio: 5.8 ratio — ABNORMAL HIGH (ref 0.0–4.4)
Cholesterol, Total: 249 mg/dL — ABNORMAL HIGH (ref 100–199)
HDL: 43 mg/dL (ref >39)
LDL Chol Calc (NIH): 176 mg/dL — ABNORMAL HIGH (ref 0–99)
Triglycerides: 163 mg/dL — ABNORMAL HIGH (ref 0–149)
VLDL Cholesterol Cal: 30 mg/dL (ref 5–40)

## 2023-03-29 LAB — TSH RFX ON ABNORMAL TO FREE T4: TSH: 0.913 u[IU]/mL (ref 0.450–4.500)

## 2023-03-30 DIAGNOSIS — R079 Chest pain, unspecified: Secondary | ICD-10-CM | POA: Insufficient documentation

## 2023-03-30 MED ORDER — TIRZEPATIDE 2.5 MG/0.5ML ~~LOC~~ SOAJ: 2.5000 mg | SUBCUTANEOUS | 0 refills | Status: DC

## 2023-03-30 NOTE — Assessment & Plan Note (Signed)
Patient presents with sharp intermittent chest pain that lasts several seconds and has been worsening over the last month. EKG showing NSR without signs of ischemia.  Low suspicion for cardiac etiology. 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 Suspect anxiety may be contributing as well (GAD 7 score of 14). Will follow up in 1-2 weeks to check on symptoms/ discuss anxiety and possible treatment. Return precautions discussed.

## 2023-03-30 NOTE — Assessment & Plan Note (Signed)
Sleep hygiene discussed and though she gets adequate amount of sleep does not feel well rested. STOP-BANG score of 4 indicating intermediate risk for moderate to severe OSA. Referral placed for sleep study. Will also check TSH, CBC for anemia.

## 2023-03-30 NOTE — Assessment & Plan Note (Signed)
BMI 43.94. Likely contributing to fatigue as well. Discussed importance of weight loss. Will check lipid panel, insulin levels for resistance. Will try starting Mounjaro 2.5 mg weekly. Continue lifestyle changes with diet and exercise

## 2023-04-04 ENCOUNTER — Encounter: Payer: Self-pay | Admitting: Family Medicine

## 2023-04-04 ENCOUNTER — Ambulatory Visit (INDEPENDENT_AMBULATORY_CARE_PROVIDER_SITE_OTHER): Payer: Medicaid Other | Admitting: Family Medicine

## 2023-04-04 ENCOUNTER — Other Ambulatory Visit: Payer: Self-pay

## 2023-04-04 VITALS — BP 132/86 | HR 86 | Ht 64.0 in | Wt 254.5 lb

## 2023-04-04 DIAGNOSIS — E785 Hyperlipidemia, unspecified: Secondary | ICD-10-CM | POA: Insufficient documentation

## 2023-04-04 DIAGNOSIS — R5383 Other fatigue: Secondary | ICD-10-CM | POA: Diagnosis not present

## 2023-04-04 NOTE — Progress Notes (Signed)
    SUBJECTIVE:   CHIEF COMPLAINT / HPI:   Patient presents for follow up from last visit  Was seen for intermittent chest pain but since visit denies any pain in her chest.   Was having increased fatigue and blood work was essentially unremarkable. Today she endorses no changes to sleep, still feeling tired. Has not heard back about sleep study.   She also has not heard anything back about Mounjaro   PERTINENT  PMH / PSH: Reviewed   OBJECTIVE:   BP 132/86   Pulse 86   Ht 5\' 4"  (1.626 m)   Wt 254 lb 8.6 oz (115.5 kg)   SpO2 99%   BMI 43.69 kg/m    General: alert, well appearing, NAD CV: RRR no murmurs Resp: CTAB normal WOB GI: soft, non distended Derm: warm, dry. No LE edema   ASSESSMENT/PLAN:   Obesity Hyperlipidemia Total Cholesterol 249 and LDL 176. Discussed healthy eating and physical activity. Called pharmacy to see if Greggory Keen was covered and they are waiting for prior auth. Recommended patient call to get on wait list at [facility] to further assist in weight loss.   Fatigue Discussed recent lab work up with normal TSH, normal hgb. Referred for sleep study given concerns for sleep apnea. Gave patient referral information so she could schedule an appointment for sleep study. Will follow up with her after sleep study.    Cora Collum, DO [facility] Center

## 2023-04-04 NOTE — Assessment & Plan Note (Signed)
Total Cholesterol 249 and LDL 176. Discussed healthy eating and physical activity. Called pharmacy to see if Greggory Keen was covered and they are waiting for prior auth. Recommended patient call to get on wait list at [facility] to further assist in weight loss.

## 2023-04-04 NOTE — Assessment & Plan Note (Signed)
Discussed recent lab work up with normal TSH, normal hgb. Referred for sleep study given concerns for sleep apnea. Gave patient referral information so she could schedule an appointment for sleep study. Will follow up with her after sleep study.

## 2023-04-04 NOTE — Patient Instructions (Addendum)
It was great seeing you today!  Today we discussed your blood work from last visit.   I also checked to see if your Greggory Keen was covered and it has to go through prior authorization. I will let you know if I hear anything back.  In the meantime I do recommend you get on the [facility] list at [facility]:  Address: [address] [place], [place] [address] Phone: [phone]  Your sleep study referral was placed at the below location. You can call to schedule an appointment: [facility] at [facility] Address: [address] [place], [place] [address] Phone: [phone]  Please check-out at the front desk before leaving the clinic. I'd like to see you back 2 weeks after you start your medication, but if you need to be seen earlier than that for any new issues we're happy to fit you in, just give [place] a call!  Feel free to call with any questions or concerns at any time, at [phone].   Take care,  Dr. Cora Collum [facility] Medicine Center

## 2023-04-06 ENCOUNTER — Telehealth: Payer: Self-pay

## 2023-04-06 NOTE — Telephone Encounter (Signed)
Rec'd fax from patients pharmacy regarding PA for [facility].   Medicaid does not cover medications for weight loss/obesity.

## 2023-04-12 ENCOUNTER — Ambulatory Visit (INDEPENDENT_AMBULATORY_CARE_PROVIDER_SITE_OTHER): Payer: Medicaid Other | Admitting: Neurology

## 2023-04-12 ENCOUNTER — Encounter: Payer: Self-pay | Admitting: Neurology

## 2023-04-12 VITALS — BP 143/95 | HR 78 | Ht 64.0 in | Wt 254.0 lb

## 2023-04-12 DIAGNOSIS — R635 Abnormal weight gain: Secondary | ICD-10-CM

## 2023-04-12 DIAGNOSIS — G4719 Other hypersomnia: Secondary | ICD-10-CM | POA: Diagnosis not present

## 2023-04-12 DIAGNOSIS — R0681 Apnea, not elsewhere classified: Secondary | ICD-10-CM | POA: Diagnosis not present

## 2023-04-12 DIAGNOSIS — R002 Palpitations: Secondary | ICD-10-CM

## 2023-04-12 DIAGNOSIS — Z9189 Other specified personal risk factors, not elsewhere classified: Secondary | ICD-10-CM | POA: Diagnosis not present

## 2023-04-12 DIAGNOSIS — R519 Headache, unspecified: Secondary | ICD-10-CM | POA: Diagnosis not present

## 2023-04-12 DIAGNOSIS — R0683 Snoring: Secondary | ICD-10-CM | POA: Diagnosis not present

## 2023-04-12 NOTE — Progress Notes (Signed)
Subjective:    Patient ID: Emma Cantu is a 27 y.o. female.  HPI    Huston Foley, MD, PhD [facility] [address], [address] [address] [place], [place] [address]  Dear Drs. Lawanna Kobus,   I saw your patient, Emma Cantu, upon your kind request in my sleep clinic today for initial consultation of her sleep disorder, in particular, concern for underlying obstructive sleep apnea.  The patient is unaccompanied today.  As you know, Emma Cantu is a 27 year old female with an underlying medical history of prediabetes, history of PE, and severe obesity with a BMI of over 40, who reports snoring and excessive daytime somnolence as well as witnessed apneas.  Her Epworth sleepiness score is 11 out of 24, fatigue severity score is 40 out of 63.  I reviewed your office note from 03/28/2023.  She has an elevated blood pressure reading today.  She has had palpitations.  She is not aware of any family history of sleep apnea.  She has had weight gain over time.  She lives with her mom, her 84 year old daughter, her maternal aunt, her sister, and 110-year-old niece.  She works as a Emergency planning/management officer.  She has a TV in her bedroom but it does not tend to stay on all night, sometimes she puts it on a white noise program.  She shares her bedroom in her bed with her 57 year old daughter.  She has a bedtime of around 8:30 PM or 9 PM and rise time around 5:30 AM.  She has a short commute, less than 10 minutes typically.  She is working on weight loss.  She has no nightly nocturia but has occasionally woken up with a dull, achy headache and sometimes takes over-the-counter ibuprofen for it.  She drinks caffeine in the form of soda, about 2 cups/day.  She is a non-smoker.  Her Past Medical History Is Significant For: Past Medical History:  Diagnosis Date   Herpes    Nexplanon insertion 12/2012   Right Arm   No pertinent past medical history    Pulmonary embolism    provoked by ankle injury  (winter 2020)     Her Past Surgical History Is Significant For: Past Surgical History:  Procedure Laterality Date   HERNIA REPAIR     inguanal hernia repair     NO PAST SURGERIES     ORIF ANKLE FRACTURE Right 01/11/2019   Procedure: OPEN REDUCTION INTERNAL FIXATION (ORIF) ANKLE FRACTURE;  Surgeon: Sheral Apley, MD;  Location: [facility];  Service: Orthopedics;  Laterality: Right;    Her Family History Is Significant For: Family History  Problem Relation Age of Onset   Hyperlipidemia Mother    Hypertension Mother    Cancer Maternal Grandmother    Cancer Maternal Grandfather    Diabetes Paternal Grandmother    Diabetes Paternal Grandfather    Stroke Other    Sleep apnea Neg Hx     Her Social History Is Significant For: Social History   Socioeconomic History   Marital status: Single    Spouse name: Not on file   Number of children: Not on file   Years of education: Not on file   Highest education level: Some college, no degree  Occupational History   Not on file  Tobacco Use   Smoking status: Never   Smokeless tobacco: Never  Vaping Use   Vaping Use: Never used  Substance and Sexual Activity   Alcohol use: No  Drug use: No   Sexual activity: Never  Other Topics Concern   Not on file  Social History Narrative   Not on file   Social Determinants of Health   Financial Resource Strain: Medium Risk (03/31/2023)   Overall Financial Resource Strain (CARDIA)    Difficulty of Paying Living Expenses: Somewhat hard  Food Insecurity: Food Insecurity Present (03/31/2023)   Hunger Vital Sign    Worried About Running Out of Food in the Last Year: Sometimes true    Ran Out of Food in the Last Year: Sometimes true  Transportation Needs: No Transportation Needs (03/31/2023)   PRAPARE - Administrator, Civil Service (Medical): No    Lack of Transportation (Non-Medical): No  Physical Activity: Insufficiently Active (03/31/2023)   Exercise Vital Sign     Days of Exercise per Week: 2 days    Minutes of Exercise per Session: 10 min  Stress: Stress Concern Present (03/31/2023)   [employer] of Occupational Health - Occupational Stress Questionnaire    Feeling of Stress : Rather much  Social Connections: Socially Isolated (03/31/2023)   Social Connection and Isolation Panel [NHANES]    Frequency of Communication with Friends and Family: Three times a week    Frequency of Social Gatherings with Friends and Family: Patient declined    Attends Religious Services: Never    Database administrator or Organizations: No    Attends Engineer, structural: Not on file    Marital Status: Never married    Her Allergies Are:  Allergies  Allergen Reactions   Banana Hives    Itchy throat   :   Her Current Medications Are:  Outpatient Encounter Medications as of 04/12/2023  Medication Sig   tirzepatide (MOUNJARO) 2.5 MG/0.5ML Pen Inject 2.5 mg into the skin once a week.   valACYclovir (VALTREX) 500 MG tablet Take 1 tablet (500 mg total) by mouth daily.   metFORMIN (GLUCOPHAGE-XR) 500 MG 24 hr tablet Take 2 tablets (1,000 mg total) by mouth daily with breakfast. (Patient not taking: Reported on 04/12/2023)   miconazole (MICATIN) 2 % cream Apply 1 application. topically 2 (two) times daily. Place externally on affected area twice daily for at least 7 days or until clear for 48 hours   No facility-administered encounter medications on file as of 04/12/2023.  :   Review of Systems:  Out of a complete 14 point review of systems, all are reviewed and negative with the exception of these symptoms as listed below:  Review of Systems  Neurological:        Pt here for sleep consult Pt snores,headaches,fatigue Pt denies hypertension,sleep study,CPAP machine    ESS:11  FSS:40    Objective:  Neurological Exam  Physical Exam Physical Examination:   Vitals:   04/12/23 1501  BP: (!) 143/95  Pulse: 78    General Examination: The patient  is a very pleasant 27 y.o. female in no acute distress. She appears well-developed and well-nourished and well groomed.   HEENT: Normocephalic, atraumatic, pupils are equal, round and reactive to light, extraocular tracking is good without limitation to gaze excursion or nystagmus noted. Hearing is grossly intact. Face is symmetric with normal facial animation. Speech is clear with no dysarthria noted. There is no hypophonia. There is no lip, neck/head, jaw or voice tremor. Neck is supple with full range of passive and active motion. There are no carotid bruits on auscultation. Oropharynx exam reveals: mild mouth dryness, good dental hygiene and moderate  airway crowding, due to small airway entry.  Tonsils are on the smaller side, Mallampati class II.  Neck circumference 15 inches.  Minimal overbite.  Tongue protrudes centrally and palate elevates symmetrically.  Chest: Clear to auscultation without wheezing, rhonchi or crackles noted.  Heart: S1+S2+0, regular and normal without murmurs, rubs or gallops noted.   Abdomen: Soft, non-tender and non-distended.  Extremities: There is no pitting edema in the distal lower extremities bilaterally.   Skin: Warm and dry without trophic changes noted.   Musculoskeletal: exam reveals no obvious joint deformities, right ankle wider than left, previous history of ankle fracture and hardware in place.   Neurologically:  Mental status: The patient is awake, alert and oriented in all 4 spheres. Her immediate and remote memory, attention, language skills and fund of knowledge are appropriate. There is no evidence of aphasia, agnosia, apraxia or anomia. Speech is clear with normal prosody and enunciation. Thought process is linear. Mood is normal and affect is normal.  Cranial nerves II - XII are as described above under HEENT exam.  Motor exam: Normal bulk, strength and tone is noted. There is no obvious action or resting tremor.  Fine motor skills and  coordination: grossly intact.  Cerebellar testing: No dysmetria or intention tremor. There is no truncal or gait ataxia.  Sensory exam: intact to light touch in the upper and lower extremities.  Gait, station and balance: She stands easily. No veering to one side is noted. No leaning to one side is noted. Posture is age-appropriate and stance is narrow based. Gait shows normal stride length and normal pace. No problems turning are noted.   Assessment and Plan:   In summary, Emma Cantu is a very pleasant 27 y.o.-year old female with an underlying medical history of prediabetes, history of PE, and severe obesity with a BMI of over 40, whose history and physical exam are concerning for sleep disordered breathing, supporting a current working diagnosis of unspecified sleep apnea,, particularly obstructive sleep apnea (OSA). A laboratory attended sleep study is typically considered "gold standard" for evaluation of sleep disordered breathing.   I had a long chat with the patient about my findings and the diagnosis of sleep apnea, particularly OSA, its prognosis and treatment options. We talked about medical/conservative treatments, surgical interventions and non-pharmacological approaches for symptom control. I explained, in particular, the risks and ramifications of untreated moderate to severe OSA, especially with respect to developing cardiovascular disease down the road, including congestive heart failure (CHF), difficult to treat hypertension, cardiac arrhythmias (particularly A-fib), neurovascular complications including TIA, stroke and dementia. Even type 2 diabetes has, in part, been linked to untreated OSA. Symptoms of untreated OSA may include (but may not be limited to) daytime sleepiness, nocturia (i.e. frequent nighttime urination), memory problems, mood irritability and suboptimally controlled or worsening mood disorder such as depression and/or anxiety, lack of energy, lack of motivation,  physical discomfort, as well as recurrent headaches, especially morning or nocturnal headaches. We talked about the importance of maintaining a healthy lifestyle and striving for healthy weight. In addition, we talked about the importance of striving for and maintaining good sleep hygiene. I recommended a sleep study at this time. I outlined the differences between a laboratory attended sleep study which is considered more comprehensive and accurate over the option of a home sleep test (HST); the latter may lead to underestimation of sleep disordered breathing in some instances and does not help with diagnosing upper airway resistance syndrome and is not accurate  enough to diagnose primary central sleep apnea typically. I outlined possible surgical and non-surgical treatment options of OSA, including the use of a positive airway pressure (PAP) device (i.e. CPAP, AutoPAP/APAP or BiPAP in certain circumstances), a custom-made dental device (aka oral appliance, which would require a referral to a specialist dentist or orthodontist typically, and is generally speaking not considered for patients with full dentures or edentulous state), upper airway surgical options, such as traditional UPPP (which is not considered a first-line treatment) or the Inspire device (hypoglossal nerve stimulator, which would involve a referral for consultation with an ENT surgeon, after careful selection, following inclusion criteria - also not first-line treatment). I explained the PAP treatment option to the patient in detail, as this is generally considered first-line treatment.  The patient indicated that she would be willing to try PAP therapy, if the need arises. I explained the importance of being compliant with PAP treatment, not only for insurance purposes but primarily to improve patient's symptoms symptoms, and for the patient's long term health benefit, including to reduce Her cardiovascular risks longer-term.    We will pick  up our discussion about the next steps and treatment options after testing.  We will keep her posted as to the test results by phone call and/or MyChart messaging where possible.  We will plan to follow-up in sleep clinic accordingly as well.  I answered all her questions today and the patient was in agreement.   I encouraged her to call with any interim questions, concerns, problems or updates or email [place] through MyChart.  Generally speaking, sleep test authorizations may take up to 2 weeks, sometimes less, sometimes longer, the patient is encouraged to get in touch with [place] if they do not hear back from the sleep lab staff directly within the next 2 weeks.  Thank you very much for allowing me to participate in the care of this nice patient. If I can be of any further assistance to you please do not hesitate to call me at [phone].  Sincerely,   Huston Foley, MD, PhD

## 2023-04-12 NOTE — Patient Instructions (Signed)

## 2023-04-21 ENCOUNTER — Telehealth: Payer: Self-pay | Admitting: Neurology

## 2023-04-21 NOTE — Telephone Encounter (Signed)
[facility] pending

## 2023-04-24 NOTE — Telephone Encounter (Signed)
Checked status on the portal it is still pending.  

## 2023-05-10 NOTE — Telephone Encounter (Signed)
[facility] [employer]: [id number] (exp. 04/21/23 to 05/26/23)  Patient is scheduled at [facility] for 06/19/23 at 9 pm.  Emailed patient with appointment information.  I will redo the auth once the appointment gets closer.

## 2023-05-29 NOTE — Telephone Encounter (Signed)
Updated Furniture conservator/restorer.  [facility]- [facility] auth: [id number] (exp. 05/29/23 to 08/26/23)   Patient is scheduled at [facility] for 06/19/23 at 9 pm.

## 2023-07-15 ENCOUNTER — Ambulatory Visit (INDEPENDENT_AMBULATORY_CARE_PROVIDER_SITE_OTHER): Payer: Medicaid Other | Admitting: Neurology

## 2023-07-15 DIAGNOSIS — R0681 Apnea, not elsewhere classified: Secondary | ICD-10-CM

## 2023-07-15 DIAGNOSIS — R0683 Snoring: Secondary | ICD-10-CM | POA: Diagnosis not present

## 2023-07-15 DIAGNOSIS — Z9189 Other specified personal risk factors, not elsewhere classified: Secondary | ICD-10-CM

## 2023-07-15 DIAGNOSIS — R519 Headache, unspecified: Secondary | ICD-10-CM

## 2023-07-15 DIAGNOSIS — G4720 Circadian rhythm sleep disorder, unspecified type: Secondary | ICD-10-CM

## 2023-07-15 DIAGNOSIS — G4719 Other hypersomnia: Secondary | ICD-10-CM

## 2023-07-15 DIAGNOSIS — R635 Abnormal weight gain: Secondary | ICD-10-CM

## 2023-07-15 DIAGNOSIS — R002 Palpitations: Secondary | ICD-10-CM

## 2023-07-17 NOTE — Procedures (Signed)
Physician Interpretation:     [facility] POLYSOMNOGRAPHY  INTERPRETATION REPORT   STUDY DATE:  07/15/2023     PATIENT NAME:  Emma Cantu         DATE OF BIRTH:  July 20, 1996  PATIENT ID:  [id number]    TYPE OF STUDY:  PSG  READING PHYSICIAN: Huston Foley, MD, PhD   SCORING TECHNICIAN: Domingo Cocking, RPSGT  Referred by: Tiffany Kocher, DO  ? History and Indication for Testing: 27 year old female with an underlying medical history of prediabetes, history of PE, and severe obesity with a BMI of over 40, who reports snoring and excessive daytime somnolence as well as witnessed apneas. Her Epworth sleepiness score is 11 out of 24, fatigue severity score is 40 out of 63.  Height: 64 in Weight: 254 lb (BMI 43) Neck Size: 15 in    MEDICATIONS: Mounjaro, Valtrex, Glucophage, Micatin   TECHNICAL DESCRIPTION: A registered sleep technologist was in attendance for the duration of the recording.  Data collection, scoring, video monitoring, and reporting were performed in compliance with the AASM Manual for the Scoring of Sleep and Associated Events; (Hypopnea is scored based on the criteria listed in Section VIII D. 1b in the AASM Manual V2.6 using a 4% oxygen desaturation rule or Hypopnea is scored based on the criteria listed in Section VIII D. 1a in the AASM Manual V2.6 using 3% oxygen desaturation and /or arousal rule).   SLEEP CONTINUITY AND SLEEP ARCHITECTURE:  Lights-out was at 21:55: and lights-on at  05:06:, with a total recording time of 7 hours, 7 min. Total sleep time ( TST) was 341.0 minutes with a mildly decreased sleep efficiency at 79.2%. There was 10.9% REM sleep.   BODY POSITION:  TST was divided  between the following sleep positions: 0.7% supine;  99.3% lateral;  0% prone. Duration of total sleep and percent of total sleep in their respective position is as follows: supine 02 minutes (1%), non-supine 339 minutes (99%); right 149 minutes (44%), left  189 minutes (55%), and prone 00 minutes (0%).  Total supine REM sleep time was 00 minutes (0% of total REM sleep).  Sleep latency was increased at 46.0 minutes.  REM sleep latency was increased at 217.5 minutes. Of the total sleep time, the percentage of stage N1 sleep was 6.7%, stage N2 sleep was 78%, which is markedly increased, stage N3 sleep was 4.3%, which is reduced, and REM sleep was 10.9%, which is reduced. Wake after sleep onset (WASO) time accounted for 43 minutes, with minimal to mild sleep fragmentation noted.   RESPIRATORY MONITORING:   Based on CMS criteria (using a 4% oxygen desaturation rule for scoring hypopneas), there were 1 apneas (1 obstructive; 0 central; 0 mixed), and 27 hypopneas.  Apnea index was 0.2. Hypopnea index was 4.8. The apnea-hypopnea index was 4.9/hour overall (48.0 supine, 13 non-supine; 13.0 REM, 0.0 supine REM).  There were 0 respiratory effort-related arousals (RERAs).  The RERA index was 0 events/h. Total respiratory disturbance index (RDI) was 4.9 events/h. RDI results showed: supine RDI  48.0 /h; non-supine RDI 4.6 /h; REM RDI 13.0 /h, supine REM RDI 0.0 /h.   Based on AASM criteria (using a 3% oxygen desaturation and /or arousal rule for scoring hypopneas), there were 1 apneas (1 obstructive; 0 central; 0 mixed), and 38 hypopneas. Apnea index was 0.2. Hypopnea index was 6.7. The apnea-hypopnea index was 6.9 overall (48.0 supine, 19 non-supine; 19.5 REM, 0.0 supine REM).  There were 0 respiratory  effort-related arousals (RERAs).  The RERA index was 0 events/h. Total respiratory disturbance index (RDI) was 6.9 events/h. RDI results showed: supine RDI  48.0 /h; non-supine RDI 6.6 /h; REM RDI 19.5 /h, supine REM RDI 0.0 /h.    OXIMETRY: Oxyhemoglobin Saturation Nadir during sleep was at  85% from a mean of 94%.  Of the Total sleep time (TST)   hypoxemia (=<88%) was present for  27.8 minutes, or 8.1% of total sleep time. There were oxygen level drops, due to sensor  dislodging during wakefulness, resulting in overestimation of oxygen desaturations.    LIMB MOVEMENTS: There were 0 periodic limb movements of sleep (0.0/hr), of which 0 (0.0/hr) were associated with an arousal.   AROUSAL: There were 40 arousals in total, for an arousal index of 7 arousals/hour.  Of these, 10 were identified as respiratory-related arousals (2 /h), 0 were PLM-related arousals (0 /h), and 38 were non-specific arousals (7 /h).   EEG: Review of the EEG showed no abnormal electrical discharges and symmetrical bihemispheric findings.    EKG: The EKG revealed normal sinus rhythm (NSR). The average heart rate during sleep was 76 bpm.   AUDIO/VIDEO REVIEW: The audio and video review did not show any abnormal or unusual behaviors, movements, phonations or vocalizations. The patient took no restroom breaks. Snoring was noted, in the mild to moderate range.  POST-STUDY QUESTIONNAIRE: Post study, the patient indicated, that sleep was the same as usual.   IMPRESSION:  1. Primary Snoring 2. Dysfunctions associated with sleep stages or arousal from sleep  RECOMMENDATIONS:  1. This study does not demonstrate any significant obstructive or central sleep disordered breathing with an AHI of less than 5/hour - Her AHI was borderline at 4.9/hour, and oxygen desaturation nadir was 85%. Mild to moderate snoring was noted. Treatment with a positive airway pressure device, such as CPAP or autoPAP is not indicated. The near-absence of supine sleep and reduced percentage of REM sleep may have led to an underestimation of her sleep disordered breathing. Weight loss and avoidance of the supine sleep position may aid in reducing her snoring. For disturbing snoring, an oral appliance (through a qualified dentist) can be considered.   2. This study shows some sleep fragmentation and abnormal sleep stage percentages; these are nonspecific findings and per se do not signify an intrinsic sleep disorder or a cause  for the patient's sleep-related symptoms. Causes include (but are not limited to) the first night effect of the sleep study, circadian rhythm disturbances, medication effect or an underlying mood disorder or medical problem.  3. The patient should be cautioned not to drive, work at heights, or operate dangerous or heavy equipment when tired or sleepy. Review and reiteration of good sleep hygiene measures should be pursued with any patient. 4. The patient will be advised to follow up with the referring provider, who will be notified of the test results.     I certify that I have reviewed the entire raw data recording prior to the issuance of this report in accordance with the Standards of Accreditation of the American Academy of Sleep Medicine (AASM).  Huston Foley, MD, PhD Medical Director, [facility]) Diplomat, ABPN (Neurology and Sleep)             Technical Report:   General Information  Name: Jaya, Lapka BMI: 43.60 Physician: Huston Foley, MD  ID: [id number] Height: 64.0 in Technician: Domingo Cocking, RPSGT  Sex: Female Weight: 254.0 lb Record: [id number]  Age:  27 [1996/07/09] Date: 07/15/2023    Medical & Medication History    27 year old female with an underlying medical history of prediabetes, history of PE, and severe obesity with a BMI of over 40, who reports snoring and excessive daytime somnolence as well as witnessed apneas. Mounjaro, Valtrex, Glucophage, Micatin   Sleep Disorder      Comments   Patient arrived for a diagnostic polysomnogram. Procedure explained and all questions answered. Standard paste setup without complications. Patient slept supine, left, and right. Mild to moderate snoring heard. Mild respiratory events observed, worse while supine. No cardiac arrhythmias observed. No significant PLMS observed. No nocturia.     Lights out: 09:55:12 PM Lights on: 05:02:22 AM   Time Total Supine Side Prone Upright   Recording (TRT) 7h 7.34m 0h 51.41m 6h 16.61m 0h 0.56m 0h 0.68m  Sleep (TST) 5h 38.29m 0h 2.71m 5h 35.67m 0h 0.36m 0h 0.57m   Latency N1 N2 N3 REM Onset Per. Slp. Eff.  Actual 0h 0.17m 0h 4.50m 0h 33.80m 3h 37.32m 0h 46.52m 0h 58.30m 79.16%   Stg Dur Wake N1 N2 N3 REM  Total 89.0 22.5 250.0 14.5 37.0  Supine 48.5 2.0 0.5 0.0 0.0  Side 40.5 20.5 249.5 14.5 37.0  Prone 0.0 0.0 0.0 0.0 0.0  Upright 0.0 0.0 0.0 0.0 0.0   Stg % Wake N1 N2 N3 REM  Total 20.8 6.7 74.0 4.3 10.9  Supine 11.4 0.6 0.1 0.0 0.0  Side 9.5 6.1 73.8 4.3 10.9  Prone 0.0 0.0 0.0 0.0 0.0  Upright 0.0 0.0 0.0 0.0 0.0     Apnea Summary Sub Supine Side Prone Upright  Total 1 Total 1 0 1 0 0    REM 0 0 0 0 0    NREM 1 0 1 0 0  Obs 1 REM 0 0 0 0 0    NREM 1 0 1 0 0  Mix 0 REM 0 0 0 0 0    NREM 0 0 0 0 0  Cen 0 REM 0 0 0 0 0    NREM 0 0 0 0 0   Rera Summary Sub Supine Side Prone Upright  Total 0 Total 0 0 0 0 0    REM 0 0 0 0 0    NREM 0 0 0 0 0   Hypopnea Summary Sub Supine Side Prone Upright  Total 38 Total 38 2 36 0 0    REM 12 0 12 0 0    NREM 26 2 24  0 0   4% Hypopnea Summary Sub Supine Side Prone Upright  Total (4%) 27 Total 27 2 25  0 0    REM 8 0 8 0 0    NREM 19 2 17  0 0     AHI Total Obs Mix Cen  6.92 Apnea 0.18 0.18 0.00 0.00   Hypopnea 6.75 -- -- --  4.97 Hypopnea (4%) 4.79 -- -- --    Total Supine Side Prone Upright  Position AHI 6.92 48.00 6.62 0.00 0.00  REM AHI 19.46   NREM AHI 5.64   Position RDI 6.92 48.00 6.62 0.00 0.00  REM RDI 19.46   NREM RDI 5.64    4% Hypopnea Total Supine Side Prone Upright  Position AHI (4%) 4.97 48.00 4.65 0.00 0.00  REM AHI (4%) 12.97   NREM AHI (4%) 4.18   Position RDI (4%) 4.97 48.00 4.65 0.00 0.00  REM RDI (4%) 12.97   NREM RDI (4%) 4.18    Desaturation  Information Threshold: 2% <100% <90% <80% <70% <60% <50% <40%  Supine 38.0 4.0 0.0 0.0 0.0 0.0 0.0  Side 172.0 11.0 0.0 0.0 0.0 0.0 0.0  Prone 0.0 0.0 0.0 0.0 0.0 0.0 0.0  Upright 0.0 0.0 0.0 0.0 0.0 0.0  0.0  Total 210.0 15.0 0.0 0.0 0.0 0.0 0.0  Index 34.3 2.5 0.0 0.0 0.0 0.0 0.0   Threshold: 3% <100% <90% <80% <70% <60% <50% <40%  Supine 22.0 4.0 0.0 0.0 0.0 0.0 0.0  Side 77.0 11.0 0.0 0.0 0.0 0.0 0.0  Prone 0.0 0.0 0.0 0.0 0.0 0.0 0.0  Upright 0.0 0.0 0.0 0.0 0.0 0.0 0.0  Total 99.0 15.0 0.0 0.0 0.0 0.0 0.0  Index 16.2 2.5 0.0 0.0 0.0 0.0 0.0   Threshold: 4% <100% <90% <80% <70% <60% <50% <40%  Supine 17.0 4.0 0.0 0.0 0.0 0.0 0.0  Side 46.0 11.0 0.0 0.0 0.0 0.0 0.0  Prone 0.0 0.0 0.0 0.0 0.0 0.0 0.0  Upright 0.0 0.0 0.0 0.0 0.0 0.0 0.0  Total 63.0 15.0 0.0 0.0 0.0 0.0 0.0  Index 10.3 2.5 0.0 0.0 0.0 0.0 0.0   Threshold: 4% <100% <90% <80% <70% <60% <50% <40%  Supine 17 4 0 0 0 0 0  Side 46 11 0 0 0 0 0  Prone 0 0 0 0 0 0 0  Upright 0 0 0 0 0 0 0  Total 63 15 0 0 0 0 0   Awakening/Arousal Information # of Awakenings 19  Wake after sleep onset 43.70m  Wake after persistent sleep 33.8m   Arousal Assoc. Arousals Index  Apneas 1 0.2  Hypopneas 9 1.6  Leg Movements 0 0.0  Snore 0 0.0  PTT Arousals 0 0.0  Spontaneous 36 6.4  Total 46 8.2  Leg Movement Information PLMS LMs Index  Total LMs during PLMS 0 0.0  LMs w/ Microarousals 0 0.0   LM LMs Index  w/ Microarousal 0 0.0  w/ Awakening 0 0.0  w/ Resp Event 0 0.0  Spontaneous 2 0.4  Total 2 0.4     Desaturation threshold setting: 4% Minimum desaturation setting: 10 seconds SaO2 nadir: 85% The longest event was a 19 sec obstructive Hypopnea with a minimum SaO2 of 90%. The lowest SaO2 was 87% associated with a 13 sec obstructive Apnea. EKG Rates EKG Avg Max Min  Awake 78 99 62  Asleep 76 89 58  EKG Events: Tachycardia

## 2023-07-18 ENCOUNTER — Telehealth: Payer: Self-pay

## 2023-07-18 NOTE — Telephone Encounter (Signed)
Contacted pt, informed her recent sleep study did not show any significant sleep apnea or significant underlying sleep disorder, no significant EKG (heart tracing) changes or EEG (brain wave tracing) changes. Her AHI was borderline at 4.9/hour - and oxygen saturation dropped to 85% briefly for the night, with mild to moderate snoring noted. Treatment with a positive airway pressure device, such as CPAP or autoPAP is not indicated.  Avoidance of the supine sleep position weight loss may reduce snoring. For disturbing snoring, an oral appliance (through a qualified dentist) can be considered. Patient did achieve all stages of sleep, but hardly any sleep on her back. At this juncture, she can follow up with her PCP and other providers as scheduled/planned, per Dr Frances Furbish.  Patient verbally understood and was appreciative.  Advised to call the office back with any questions or concerns as she had none at this time

## 2023-07-18 NOTE — Telephone Encounter (Signed)
-----   Message from Huston Foley sent at 07/17/2023  5:39 PM EDT ----- Patient referred by her PCP, seen by me on 04/12/23, patient had a diagnostic PSG on 07/15/23.   Please call and notify the patient that the recent sleep study did not show any significant sleep apnea or significant underlying sleep disorder, no significant EKG (heart tracing) changes or EEG (brain wave tracing) changes. Her AHI was borderline at 4.9/hour - and oxygen saturation dropped to 85% briefly for the night, with mild to moderate snoring noted. Treatment with a positive airway pressure device, such as CPAP or autoPAP is not indicated.  Avoidance of the supine sleep position weight loss may reduce snoring. For disturbing snoring, an oral appliance (through a qualified dentist) can be considered.  If she would like to explore this treatment option, we can facilitate with a referral. Patient did achieve all stages of sleep, but hardly any sleep on her back. At this juncture, she can follow up with her PCP and other providers as scheduled/planned.   Thanks,  Huston Foley, MD, PhD [facility])

## 2023-10-30 ENCOUNTER — Ambulatory Visit: Payer: Medicaid Other | Admitting: Student

## 2023-10-30 ENCOUNTER — Encounter: Payer: Self-pay | Admitting: Family Medicine

## 2023-10-30 ENCOUNTER — Ambulatory Visit (INDEPENDENT_AMBULATORY_CARE_PROVIDER_SITE_OTHER): Payer: Medicaid Other | Admitting: Family Medicine

## 2023-10-30 VITALS — BP 133/81 | HR 95 | Ht 64.0 in | Wt 258.1 lb

## 2023-10-30 DIAGNOSIS — Z6841 Body Mass Index (BMI) 40.0 and over, adult: Secondary | ICD-10-CM | POA: Diagnosis not present

## 2023-10-30 DIAGNOSIS — E66813 Obesity, class 3: Secondary | ICD-10-CM | POA: Diagnosis present

## 2023-10-30 DIAGNOSIS — Z23 Encounter for immunization: Secondary | ICD-10-CM

## 2023-10-30 DIAGNOSIS — Z87898 Personal history of other specified conditions: Secondary | ICD-10-CM | POA: Diagnosis not present

## 2023-10-30 LAB — POCT GLYCOSYLATED HEMOGLOBIN (HGB A1C): Hemoglobin A1C: 5.4 % (ref 4.0–5.6)

## 2023-10-30 MED ORDER — WEGOVY 0.25 MG/0.5ML ~~LOC~~ SOAJ: 0.2500 mg | SUBCUTANEOUS | 0 refills | Status: DC

## 2023-10-30 NOTE — Progress Notes (Signed)
    SUBJECTIVE:   CHIEF COMPLAINT / HPI:   Interested in starting mounjaro for weight loss  Has been trying exercise and dietary changes for several months. Still feels like her weight fluctuates a lot  Exercise - goes to gym for 1hr 2-3 days per week in addition to home exercises. 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 Also tried walking multiple times per week Diet - has tried cutting out fried and fatty foods, increasing vegetable intake  Has gained 4lb since April  Hx prediabetes, last A1c 5.5 Used to take metformin but no longer on this Denies hx of pancreatitis, thyroid cancer  Otherwise, reports no acute concerns  PERTINENT  PMH / PSH: prediabetes   OBJECTIVE:   BP 133/81   Pulse 95   Ht 5\' 4"  (1.626 m)   Wt 258 lb 2 oz (117.1 kg)   SpO2 97%   BMI 44.31 kg/m    General: NAD, pleasant, able to participate in exam Cardiac: RRR, no murmurs auscultated Respiratory: CTAB, normal WOB Abdomen: soft, non-tender, non-distended, normoactive bowel sounds Extremities: warm and well perfused, no edema or cyanosis Skin: warm and dry, no rashes noted Neuro: alert, no obvious focal deficits, speech normal Psych: Normal affect and mood  ASSESSMENT/PLAN:   Assessment & Plan Class 3 severe obesity due to excess calories with body mass index (BMI) of 40.0 to 44.9 in adult, unspecified whether serious comorbidity present [facility]) Patient has failed lifestyle interventions for weight loss.  History of prediabetes.  She is interested in starting medical therapy for weight loss.  Start Wegovy 0.25 mg weekly, follow-up in 4 weeks for titration History of prediabetes A1c 5.4 today, improved from 5.5. No longer prediabetic. starting RUEAVW as above for weight loss Need for vaccination Tdap given today   Vonna Drafts, MD [facility]

## 2023-10-30 NOTE — Assessment & Plan Note (Addendum)
A1c 5.4 today, improved from 5.5. No longer prediabetic. starting ZOXWRU as above for weight loss

## 2023-10-30 NOTE — Patient Instructions (Addendum)
I have ordered Wegovy to help with weight loss - please follow up in 4 weeks to adjust dosing

## 2023-10-30 NOTE — Assessment & Plan Note (Signed)
Patient has failed lifestyle interventions for weight loss.  History of prediabetes.  She is interested in starting medical therapy for weight loss.  Start Wegovy 0.25 mg weekly, follow-up in 4 weeks for titration

## 2023-11-06 ENCOUNTER — Telehealth: Payer: Self-pay

## 2023-11-06 ENCOUNTER — Other Ambulatory Visit (HOSPITAL_COMMUNITY): Payer: Self-pay

## 2023-11-06 NOTE — Telephone Encounter (Signed)
Pharmacy Patient Advocate Encounter   Received notification from Fax that prior authorization for [facility] is required/requested.   Insurance verification completed.   The patient is insured through [facility] MEDICAID .   Per test claim: PA required; PA submitted to above mentioned insurance via CoverMyMeds Key/confirmation #/EOC [id number]. Status is pending

## 2023-11-10 NOTE — Telephone Encounter (Signed)
Pharmacy Patient Advocate Encounter  Received notification from [facility] MEDICAID that Prior Authorization for [facility] has been APPROVED from 11/07/23 to 05/06/24

## 2023-12-15 ENCOUNTER — Other Ambulatory Visit: Payer: Self-pay | Admitting: Student

## 2023-12-15 DIAGNOSIS — A6000 Herpesviral infection of urogenital system, unspecified: Secondary | ICD-10-CM

## 2024-01-04 ENCOUNTER — Other Ambulatory Visit: Payer: Self-pay | Admitting: Student

## 2024-01-04 DIAGNOSIS — Z131 Encounter for screening for diabetes mellitus: Secondary | ICD-10-CM | POA: Diagnosis not present

## 2024-01-04 DIAGNOSIS — Z6841 Body Mass Index (BMI) 40.0 and over, adult: Secondary | ICD-10-CM | POA: Diagnosis not present

## 2024-01-04 DIAGNOSIS — A6000 Herpesviral infection of urogenital system, unspecified: Secondary | ICD-10-CM

## 2024-01-04 DIAGNOSIS — Z0000 Encounter for general adult medical examination without abnormal findings: Secondary | ICD-10-CM | POA: Diagnosis not present

## 2024-01-04 DIAGNOSIS — Z3200 Encounter for pregnancy test, result unknown: Secondary | ICD-10-CM | POA: Diagnosis not present

## 2024-01-04 DIAGNOSIS — Z13220 Encounter for screening for lipoid disorders: Secondary | ICD-10-CM | POA: Diagnosis not present

## 2024-01-04 DIAGNOSIS — E559 Vitamin D deficiency, unspecified: Secondary | ICD-10-CM | POA: Diagnosis not present

## 2024-01-04 DIAGNOSIS — R5383 Other fatigue: Secondary | ICD-10-CM | POA: Diagnosis not present

## 2024-01-05 MED ORDER — VALACYCLOVIR HCL 500 MG PO TABS: 500.0000 mg | ORAL_TABLET | Freq: Every day | ORAL | 0 refills | Status: DC

## 2024-01-12 DIAGNOSIS — E7800 Pure hypercholesterolemia, unspecified: Secondary | ICD-10-CM | POA: Diagnosis not present

## 2024-01-12 DIAGNOSIS — E559 Vitamin D deficiency, unspecified: Secondary | ICD-10-CM | POA: Diagnosis not present

## 2024-01-12 DIAGNOSIS — Z6841 Body Mass Index (BMI) 40.0 and over, adult: Secondary | ICD-10-CM | POA: Diagnosis not present

## 2024-01-25 ENCOUNTER — Other Ambulatory Visit: Payer: Self-pay | Admitting: Student

## 2024-01-25 DIAGNOSIS — A6000 Herpesviral infection of urogenital system, unspecified: Secondary | ICD-10-CM

## 2024-01-25 MED ORDER — VALACYCLOVIR HCL 500 MG PO TABS: 500.0000 mg | ORAL_TABLET | Freq: Every day | ORAL | 0 refills | Status: DC

## 2024-02-04 ENCOUNTER — Other Ambulatory Visit: Payer: Self-pay | Admitting: Student

## 2024-02-04 DIAGNOSIS — A6000 Herpesviral infection of urogenital system, unspecified: Secondary | ICD-10-CM

## 2024-02-06 ENCOUNTER — Encounter: Payer: Self-pay | Admitting: Student

## 2024-02-08 ENCOUNTER — Other Ambulatory Visit (HOSPITAL_COMMUNITY)
Admission: RE | Admit: 2024-02-08 | Discharge: 2024-02-08 | Disposition: A | Discharge status: Home / Self Care | Payer: Medicaid Other | Source: Ambulatory Visit | Attending: Family Medicine | Admitting: Family Medicine

## 2024-02-08 ENCOUNTER — Ambulatory Visit (INDEPENDENT_AMBULATORY_CARE_PROVIDER_SITE_OTHER): Payer: Medicaid Other | Admitting: Student

## 2024-02-08 VITALS — BP 123/80 | HR 89 | Temp 99.1°F | Ht 64.0 in | Wt 257.2 lb

## 2024-02-08 DIAGNOSIS — B9689 Other specified bacterial agents as the cause of diseases classified elsewhere: Secondary | ICD-10-CM

## 2024-02-08 DIAGNOSIS — N760 Acute vaginitis: Secondary | ICD-10-CM | POA: Diagnosis not present

## 2024-02-08 DIAGNOSIS — Z113 Encounter for screening for infections with a predominantly sexual mode of transmission: Secondary | ICD-10-CM

## 2024-02-08 DIAGNOSIS — N644 Mastodynia: Secondary | ICD-10-CM | POA: Diagnosis not present

## 2024-02-08 DIAGNOSIS — A6000 Herpesviral infection of urogenital system, unspecified: Secondary | ICD-10-CM | POA: Diagnosis not present

## 2024-02-08 LAB — POCT WET PREP (WET MOUNT)
Clue Cells Wet Prep Whiff POC: POSITIVE
Trichomonas Wet Prep HPF POC: ABSENT

## 2024-02-08 MED ORDER — VALACYCLOVIR HCL 500 MG PO TABS: 500.0000 mg | ORAL_TABLET | Freq: Every day | ORAL | 0 refills | Status: DC

## 2024-02-08 NOTE — Progress Notes (Addendum)
    SUBJECTIVE:   CHIEF COMPLAINT / HPI: STI screening  The patient reports vaginal irritation and odor- wondering if she has BV She uses [name] for contraception.  Does not get periods on her IUD.  Current partners -she has recently had new partners (female).  Last Pap December 2023 (NILM). Next due December 2026  Prior infections include Chlamydia  She is not a PREP candidate.   PERTINENT  PMH / PSH:  Prediabetes Hx BV, vaginal candidiasis Obesity  OBJECTIVE:   BP 123/80   Pulse 89   Temp 99.1 F (37.3 C)   Ht 5\' 4"  (1.626 m)   Wt 257 lb 3.2 oz (116.7 kg)   SpO2 99%   BMI 44.15 kg/m   General: NAD, well appearing Cardiac: RRR Neuro: A&O Respiratory: normal WOB on RA. No wheezing or crackles on auscultation, good lung sounds throughout Extremities: Moving all 4 extremities equally GU: Gilberto Better, CMA present as chaperone. External vulva and vagina nonerythematous, without any obvious lesions or rash. White tinted discharge appreciated.  Normal ruggae of vaginal walls.  Cervix is non erythematous and non-friable.  There is no cervical motion tenderness, masses or gross abnormalities appreciated during bimanual exam.    ASSESSMENT/PLAN:   Bacterial vaginosis Wet prep positive for moderate clue cells/+ whiff Treat with Flagyl 500 mg twice daily for 7 days Followed by fluconazole 150 mg tablet x 1 for vaginal candidiasis Discussed condom and other barrier use methods during sexual intercourse  Screening examination for STI Pending cervicovaginal swab for gonorrhea, chlamydia, trichomonas F/u results    Darral Dash, DO [facility]

## 2024-02-08 NOTE — Patient Instructions (Addendum)
It was great seeing you today.  As we discussed, - We collected testing today. I will call if anything is abnormal. If everything comes back normal, I will send a MyChart message   If you have any questions or concerns, please feel free to call the clinic.   Have a wonderful day,  Dr. Darral Dash [facility] [phone]

## 2024-02-10 DIAGNOSIS — Z113 Encounter for screening for infections with a predominantly sexual mode of transmission: Secondary | ICD-10-CM | POA: Insufficient documentation

## 2024-02-10 DIAGNOSIS — N760 Acute vaginitis: Secondary | ICD-10-CM | POA: Insufficient documentation

## 2024-02-10 DIAGNOSIS — B9689 Other specified bacterial agents as the cause of diseases classified elsewhere: Secondary | ICD-10-CM | POA: Insufficient documentation

## 2024-02-10 MED ORDER — FLUCONAZOLE 150 MG PO TABS: 150.0000 mg | ORAL_TABLET | Freq: Once | ORAL | 0 refills | Status: AC

## 2024-02-10 MED ORDER — METRONIDAZOLE 500 MG PO TABS: 500.0000 mg | ORAL_TABLET | Freq: Two times a day (BID) | ORAL | 0 refills | Status: DC

## 2024-02-10 NOTE — Assessment & Plan Note (Addendum)
 Wet prep positive for moderate clue cells/+ whiff Treat with Flagyl 500 mg twice daily for 7 days Followed by fluconazole 150 mg tablet x 1 for vaginal candidiasis Discussed condom and other barrier use methods during sexual intercourse

## 2024-02-10 NOTE — Assessment & Plan Note (Signed)
 Pending cervicovaginal swab for gonorrhea, chlamydia, trichomonas F/u results

## 2024-02-12 LAB — CERVICOVAGINAL ANCILLARY ONLY
Chlamydia: NEGATIVE
Comment: NEGATIVE
Comment: NEGATIVE
Comment: NORMAL
Neisseria Gonorrhea: NEGATIVE
Trichomonas: NEGATIVE

## 2024-02-22 NOTE — Progress Notes (Signed)
  SUBJECTIVE:   CHIEF COMPLAINT / HPI:   STI check -Presenting today with continued vaginal discharge states she finished medications provided by Dr. Melissa Noon at last visit on 2/13.  She had a condom break with sexual encounter on 2/13 is requesting STD testing today. - Sexually active with 1 partner(s) - Contraception: [name] Symptoms include: Abnormal vaginal discharge   PERTINENT  PMH / PSH: BV, vaginal candidiasis, chlamydia, gonorrhea  OBJECTIVE:  BP 137/87   Pulse 87   Ht 5\' 4"  (1.626 m)   Wt 256 lb 6.4 oz (116.3 kg)   SpO2 100%   BMI 44.01 kg/m  Pelvic exam: VULVA: normal appearing vulva with no masses, tenderness or lesions, VAGINA: vaginal discharge - white and creamy, CERVIX: Not observed, exam chaperoned by Drusilla Kanner, CMA.   ASSESSMENT/PLAN:   Assessment & Plan Vaginal discharge Advised continued use of condoms.  Wet prep and vaginal swab for STD testing obtained.  Patient stated she would wait for later time to screen for blood related STDs. Return if symptoms worsen or fail to improve. Shelby Mattocks, DO 02/23/2024, 2:48 PM PGY-3, [facility]

## 2024-02-23 ENCOUNTER — Encounter: Payer: Self-pay | Admitting: Student

## 2024-02-23 ENCOUNTER — Other Ambulatory Visit (HOSPITAL_COMMUNITY)
Admission: RE | Admit: 2024-02-23 | Discharge: 2024-02-23 | Disposition: A | Discharge status: Home / Self Care | Source: Ambulatory Visit | Attending: Family Medicine | Admitting: Family Medicine

## 2024-02-23 ENCOUNTER — Ambulatory Visit: Payer: Medicaid Other | Admitting: Student

## 2024-02-23 ENCOUNTER — Ambulatory Visit (INDEPENDENT_AMBULATORY_CARE_PROVIDER_SITE_OTHER): Payer: Medicaid Other | Admitting: Student

## 2024-02-23 VITALS — BP 137/87 | HR 87 | Ht 64.0 in | Wt 256.4 lb

## 2024-02-23 DIAGNOSIS — N898 Other specified noninflammatory disorders of vagina: Secondary | ICD-10-CM | POA: Insufficient documentation

## 2024-02-23 LAB — POCT WET PREP (WET MOUNT)
Clue Cells Wet Prep Whiff POC: NEGATIVE
Trichomonas Wet Prep HPF POC: ABSENT

## 2024-02-23 NOTE — Patient Instructions (Addendum)
 It was great to see you today! Thank you for choosing [facility] for your primary care.  Today we addressed: We have tested for STDs and BV and yeast today.   If you haven't already, sign up for My Chart to have easy access to your labs results, and communication with your primary care physician.  Return if symptoms worsen or fail to improve. Please arrive 15 minutes before your appointment to ensure smooth check in process.  We appreciate your efforts in making this happen.  Thank you for allowing me to participate in your care, Shelby Mattocks, DO 02/23/2024, 2:16 PM PGY-3, [facility]

## 2024-02-27 LAB — URINE CYTOLOGY ANCILLARY ONLY
Chlamydia: NEGATIVE
Comment: NEGATIVE
Comment: NEGATIVE
Comment: NORMAL
Neisseria Gonorrhea: NEGATIVE
Trichomonas: NEGATIVE

## 2024-02-29 ENCOUNTER — Other Ambulatory Visit: Payer: Self-pay | Admitting: Student

## 2024-02-29 ENCOUNTER — Ambulatory Visit: Admitting: Family Medicine

## 2024-02-29 ENCOUNTER — Encounter: Payer: Self-pay | Admitting: Family Medicine

## 2024-02-29 VITALS — BP 138/89 | HR 94 | Temp 98.6°F | Ht 64.0 in | Wt 258.4 lb

## 2024-02-29 DIAGNOSIS — H9201 Otalgia, right ear: Secondary | ICD-10-CM | POA: Diagnosis present

## 2024-02-29 DIAGNOSIS — A6000 Herpesviral infection of urogenital system, unspecified: Secondary | ICD-10-CM

## 2024-02-29 MED ORDER — CIPROFLOXACIN-DEXAMETHASONE 0.3-0.1 % OT SUSP: 4.0000 [drp] | Freq: Two times a day (BID) | OTIC | 0 refills | Status: AC

## 2024-02-29 NOTE — Patient Instructions (Addendum)
 It was great to see you today! Thank you for choosing [facility] for your primary care. Emma Cantu was seen for R ear itching and pain.  Today we addressed: R ear itching/pain Prescribed Ciprodex drops for both ears due to circumferential wax distribution and pimple that was noted in your R ear. You can use 4 drops into each ear for 7 days.   You should return to our clinic No follow-ups on file. Please arrive 15 minutes before your appointment to ensure smooth check in process.  We appreciate your efforts in making this happen.  Thank you for allowing me to participate in your care, Fortunato Curling, DO 02/29/2024, 4:02 PM PGY-1, [facility]

## 2024-02-29 NOTE — Progress Notes (Signed)
   SUBJECTIVE:   CHIEF COMPLAINT / HPI: R ear itching/pain  R ear itching/pain Patient reports that yesterday she noticed some itching and a bump in her R ear. She has previously had Swimmer's ear in the past when she was younger. Reports that the last time the area was exposed to any water was on 2/28. Denies going to the pool or hot tubs. Clear drainage noted when she scratches. Denies any blood.   PERTINENT  PMH / PSH: Swimmer's ear, Seasonal Allergies  OBJECTIVE:   BP 138/89   Pulse 94   Ht 5\' 4"  (1.626 m)   Wt 258 lb 6.4 oz (117.2 kg)   SpO2 100%   BMI 44.35 kg/m   General: Awake and Alert in NAD HEENT: NCAT. Sclera anicteric. No rhinorrhea. Circumferential cerumen build-up in bilateral ears and pimple noted on end crus helix of R ear, with TTP Cardiovascular: RRR. No M/R/G Respiratory: CTAB, normal WOB on RA. No wheezing, crackles, rhonchi, or diminished breath sounds. Abdomen: Soft, non-tender, non-distended. Extremities: Able to move all extremities. No BLE edema, no deformities or significant joint findings. Skin: Warm and dry. No abrasions or rashes noted. Neuro: No focal neurological deficits.  ASSESSMENT/PLAN:   Assessment & Plan Right ear pain R ear pain noted over end of crus helix of R ear where there seems to be a pimple. There is also notable circumferential cerumen noted bilaterally. - Ciprodex drops - 4 drops bilaterally for 7 days prescribed  Fortunato Curling, DO [facility]

## 2024-03-01 MED ORDER — VALACYCLOVIR HCL 500 MG PO TABS: 500.0000 mg | ORAL_TABLET | Freq: Every day | ORAL | 0 refills | Status: DC

## 2024-03-13 DIAGNOSIS — Z6841 Body Mass Index (BMI) 40.0 and over, adult: Secondary | ICD-10-CM | POA: Diagnosis not present

## 2024-03-13 DIAGNOSIS — E7800 Pure hypercholesterolemia, unspecified: Secondary | ICD-10-CM | POA: Diagnosis not present

## 2024-03-13 DIAGNOSIS — E162 Hypoglycemia, unspecified: Secondary | ICD-10-CM | POA: Diagnosis not present

## 2024-03-13 DIAGNOSIS — Z7251 High risk heterosexual behavior: Secondary | ICD-10-CM | POA: Diagnosis not present

## 2024-03-13 DIAGNOSIS — E559 Vitamin D deficiency, unspecified: Secondary | ICD-10-CM | POA: Diagnosis not present

## 2024-03-13 DIAGNOSIS — Z114 Encounter for screening for human immunodeficiency virus [HIV]: Secondary | ICD-10-CM | POA: Diagnosis not present

## 2024-03-20 DIAGNOSIS — D509 Iron deficiency anemia, unspecified: Secondary | ICD-10-CM | POA: Diagnosis not present

## 2024-03-20 DIAGNOSIS — J301 Allergic rhinitis due to pollen: Secondary | ICD-10-CM | POA: Diagnosis not present

## 2024-03-20 DIAGNOSIS — R42 Dizziness and giddiness: Secondary | ICD-10-CM | POA: Diagnosis not present

## 2024-03-20 DIAGNOSIS — H6123 Impacted cerumen, bilateral: Secondary | ICD-10-CM | POA: Diagnosis not present

## 2024-03-20 DIAGNOSIS — Z6841 Body Mass Index (BMI) 40.0 and over, adult: Secondary | ICD-10-CM | POA: Diagnosis not present

## 2024-03-27 ENCOUNTER — Other Ambulatory Visit: Payer: Self-pay | Admitting: Student

## 2024-03-27 DIAGNOSIS — A6000 Herpesviral infection of urogenital system, unspecified: Secondary | ICD-10-CM

## 2024-03-27 MED ORDER — VALACYCLOVIR HCL 500 MG PO TABS
500.0000 mg | ORAL_TABLET | Freq: Every day | ORAL | 2 refills | Status: AC
Start: 2024-03-27 — End: ?

## 2024-04-14 ENCOUNTER — Ambulatory Visit (HOSPITAL_COMMUNITY)
Admission: EM | Admit: 2024-04-14 | Discharge: 2024-04-14 | Disposition: A | Discharge status: Home / Self Care | Attending: Physician Assistant | Admitting: Physician Assistant

## 2024-04-14 ENCOUNTER — Encounter (HOSPITAL_COMMUNITY): Payer: Self-pay

## 2024-04-14 DIAGNOSIS — L0291 Cutaneous abscess, unspecified: Secondary | ICD-10-CM

## 2024-04-14 HISTORY — DX: Pure hypercholesterolemia, unspecified: E78.00

## 2024-04-14 MED ORDER — FLUCONAZOLE 150 MG PO TABS: ORAL_TABLET | ORAL | 0 refills | Status: DC

## 2024-04-14 MED ORDER — DOXYCYCLINE HYCLATE 100 MG PO CAPS: 100.0000 mg | ORAL_CAPSULE | Freq: Two times a day (BID) | ORAL | 0 refills | Status: AC

## 2024-04-14 NOTE — ED Provider Notes (Addendum)
 [facility]    CSN: [id number] Arrival date & time: 04/14/24  1619      History   Chief Complaint Chief Complaint  Patient presents with   Rash    HPI Emma Cantu is a 28 y.o. female.   Patient here today for evaluation of an area of swelling to her inner left thigh that started 2 days ago.  She reports significant pain to the area.  She has tried using warm water without resolution.  She has not had fever.  The history is provided by the patient.  Rash Associated symptoms: no abdominal pain, no fever, no nausea and not vomiting     Past Medical History:  Diagnosis Date   Herpes    High cholesterol    Nexplanon  insertion 12/2012   Right Arm   No pertinent past medical history    Pulmonary embolism (HCC)    provoked by ankle injury (winter 2020)     Patient Active Problem List   Diagnosis Date Noted   Bacterial vaginosis 02/10/2024   Screening examination for STI 02/10/2024   Hyperlipidemia 04/04/2023   Chest pain 03/30/2023   Lower urinary tract symptoms 07/01/2022   Vaginal candidiasis 05/12/2022   History of prediabetes 01/12/2022   Breast mass in female 10/03/2021   Subungual contusion of toenail, left, initial encounter 06/30/2020   Class 3 severe obesity with body mass index (BMI) of 40.0 to 44.9 in adult [facility]) 04/13/2020   Fatigue 09/27/2019   Eczema 09/23/2016    Past Surgical History:  Procedure Laterality Date   HERNIA REPAIR     inguanal hernia repair     NO PAST SURGERIES     ORIF ANKLE FRACTURE Right 01/11/2019   Procedure: OPEN REDUCTION INTERNAL FIXATION (ORIF) ANKLE FRACTURE;  Surgeon: Saundra Curl, MD;  Location: [facility];  Service: Orthopedics;  Laterality: Right;    OB History     Gravida  1   Para  1   Term  1   Preterm  0   AB  0   Living  1      SAB  0   IAB  0   Ectopic  0   Multiple  0   Live Births  1            Home Medications    Prior to Admission medications    Medication Sig Start Date End Date Taking? Authorizing Provider  doxycycline  (VIBRAMYCIN ) 100 MG capsule Take 1 capsule (100 mg total) by mouth 2 (two) times daily for 7 days. 04/14/24 04/21/24 Yes Vernestine Gondola, PA-C  fluconazole  (DIFLUCAN ) 150 MG tablet Take one tab PO today and repeat dose in 3 days if symptoms persist 04/14/24  Yes Vernestine Gondola, PA-C  Semaglutide -Weight Management (WEGOVY ) 0.25 MG/0.5ML SOAJ Inject 0.25 mg into the skin once a week. 10/30/23  Yes Edison Gore, MD  valACYclovir  (VALTREX ) 500 MG tablet Take 1 tablet (500 mg total) by mouth daily. 03/27/24  Yes Lavada Porteous, DO  metroNIDAZOLE  (FLAGYL ) 500 MG tablet Take 1 tablet (500 mg total) by mouth 2 (two) times daily. 02/10/24   Dameron, Marisa, DO    Family History Family History  Problem Relation Age of Onset   Hyperlipidemia Mother    Hypertension Mother    Cancer Maternal Grandmother    Cancer Maternal Grandfather    Diabetes Paternal Grandmother    Diabetes Paternal Grandfather    Stroke Other    Sleep apnea  Neg Hx     Social History Social History   Tobacco Use   Smoking status: Never   Smokeless tobacco: Never  Vaping Use   Vaping status: Never Used  Substance Use Topics   Alcohol use: No   Drug use: No     Allergies   Banana   Review of Systems Review of Systems  Constitutional:  Negative for chills and fever.  Eyes:  Negative for discharge and redness.  Gastrointestinal:  Negative for abdominal pain, nausea and vomiting.  Skin:  Positive for color change. Negative for rash.     Physical Exam Triage Vital Signs ED Triage Vitals [04/14/24 1713]  Encounter Vitals Group     BP      Systolic BP Percentile      Diastolic BP Percentile      Pulse      Resp      Temp      Temp src      SpO2      Weight 256 lb (116.1 kg)     Height 5\' 4"  (1.626 m)     Head Circumference      Peak Flow      Pain Score 0     Pain Loc      Pain Education      Exclude from Growth Chart     No data found.  Updated Vital Signs BP 125/82 (BP Location: Left Arm)   Pulse 81   Temp 98.9 F (37.2 C) (Oral)   Resp 18   Ht 5\' 4"  (1.626 m)   Wt 256 lb (116.1 kg)   SpO2 98%   BMI 43.94 kg/m   Visual Acuity Right Eye Distance:   Left Eye Distance:   Bilateral Distance:    Right Eye Near:   Left Eye Near:    Bilateral Near:     Physical Exam Vitals and nursing note reviewed.  Constitutional:      General: She is not in acute distress.    Appearance: Normal appearance. She is not ill-appearing.  HENT:     Head: Normocephalic and atraumatic.  Eyes:     Conjunctiva/sclera: Conjunctivae normal.  Cardiovascular:     Rate and Rhythm: Normal rate.  Pulmonary:     Effort: Pulmonary effort is normal. No respiratory distress.  Skin:    Comments: Significant area of erythema and swelling present to left inner upper thigh with induration noted.  Neurological:     Mental Status: She is alert.  Psychiatric:        Mood and Affect: Mood normal.        Behavior: Behavior normal.        Thought Content: Thought content normal.      UC Treatments / Results  Labs (all labs ordered are listed, but only abnormal results are displayed) Labs Reviewed - No data to display  EKG   Radiology No results found.  Procedures Procedures (including critical care time)  Medications Ordered in UC Medications - No data to display  Initial Impression / Assessment and Plan / UC Course  I have reviewed the triage vital signs and the nursing notes.  Pertinent labs & imaging results that were available during my care of the patient were reviewed by me and considered in my medical decision making (see chart for details).    Suspect spontaneous drainage will occur within the next 24 hours based on appearance and advised continued warm compresses.  Will treat with doxycycline .  Encouraged follow-up if no improvement or with any worsening symptoms.  Diflucan  prescribed as patient  reports she will typically get yeast infections after antibiotic therapy.  Final Clinical Impressions(s) / UC Diagnoses   Final diagnoses:  Abscess   Discharge Instructions   None    ED Prescriptions     Medication Sig Dispense Auth. Provider   doxycycline  (VIBRAMYCIN ) 100 MG capsule Take 1 capsule (100 mg total) by mouth 2 (two) times daily for 7 days. 14 capsule Jami Mcclintock F, PA-C   fluconazole  (DIFLUCAN ) 150 MG tablet Take one tab PO today and repeat dose in 3 days if symptoms persist 2 tablet Vernestine Gondola, PA-C      PDMP not reviewed this encounter.   Vernestine Gondola, PA-C 04/14/24 1732    Vernestine Gondola, PA-C 04/14/24 [phone]

## 2024-04-14 NOTE — ED Triage Notes (Signed)
 Pt states that she has a rash on the inner left thigh. X2 days

## 2024-06-12 DIAGNOSIS — Z7251 High risk heterosexual behavior: Secondary | ICD-10-CM | POA: Diagnosis not present

## 2024-06-12 DIAGNOSIS — J3081 Allergic rhinitis due to animal (cat) (dog) hair and dander: Secondary | ICD-10-CM | POA: Diagnosis not present

## 2024-06-12 DIAGNOSIS — L299 Pruritus, unspecified: Secondary | ICD-10-CM | POA: Diagnosis not present

## 2024-06-12 DIAGNOSIS — Z6841 Body Mass Index (BMI) 40.0 and over, adult: Secondary | ICD-10-CM | POA: Diagnosis not present

## 2024-06-17 ENCOUNTER — Ambulatory Visit (HOSPITAL_COMMUNITY)
Admission: EM | Admit: 2024-06-17 | Discharge: 2024-06-17 | Disposition: A | Discharge status: Home / Self Care | Attending: Internal Medicine | Admitting: Internal Medicine

## 2024-06-17 ENCOUNTER — Encounter (HOSPITAL_COMMUNITY): Payer: Self-pay

## 2024-06-17 DIAGNOSIS — R102 Pelvic and perineal pain: Secondary | ICD-10-CM | POA: Diagnosis not present

## 2024-06-17 DIAGNOSIS — N3000 Acute cystitis without hematuria: Secondary | ICD-10-CM | POA: Insufficient documentation

## 2024-06-17 LAB — POCT URINALYSIS DIP (MANUAL ENTRY)
Bilirubin, UA: NEGATIVE
Blood, UA: NEGATIVE
Glucose, UA: NEGATIVE mg/dL
Nitrite, UA: NEGATIVE
Spec Grav, UA: 1.02 (ref 1.010–1.025)
Urobilinogen, UA: 0.2 U/dL
pH, UA: 6 (ref 5.0–8.0)

## 2024-06-17 MED ORDER — NITROFURANTOIN MONOHYD MACRO 100 MG PO CAPS
100.0000 mg | ORAL_CAPSULE | Freq: Two times a day (BID) | ORAL | 0 refills | Status: DC
Start: 2024-06-17 — End: 2024-08-08

## 2024-06-17 MED ORDER — FLUCONAZOLE 150 MG PO TABS: 150.0000 mg | ORAL_TABLET | Freq: Every day | ORAL | 0 refills | Status: AC

## 2024-06-17 NOTE — ED Provider Notes (Signed)
 [facility]    CSN: [id number] Arrival date & time: 06/17/24  1700      History   Chief Complaint Chief Complaint  Patient presents with   Hip Pain   Abdominal Cramping    HPI Emma Cantu is a 28 y.o. female.   28 year old female who presents urgent care with complaints of spasms in her lower abdomen, pressure and back pain.  She reports that this has been going on for 3 days.  She reports that everything started with whole body itching then tingling.  She went to [facility] medical secondary to this and had blood work done which was negative.  She then started to have spasms in the pelvic area especially on the right as well as pressure in the lower abdomen progressing to back pain.  She denies any fevers, chills, hematuria, dysuria, diarrhea, nausea, vomiting.  She does report that she gets a very full feeling when she eats.  She is trying to stay hydrated and has recently stopped drinking juices and sodas.     Hip Pain Pertinent negatives include no chest pain, no abdominal pain and no shortness of breath.  Abdominal Cramping Pertinent negatives include no chest pain, no abdominal pain and no shortness of breath.    Past Medical History:  Diagnosis Date   Herpes    High cholesterol    Nexplanon  insertion 12/2012   Right Arm   No pertinent past medical history    Pulmonary embolism (HCC)    provoked by ankle injury (winter 2020)     Patient Active Problem List   Diagnosis Date Noted   Bacterial vaginosis 02/10/2024   Screening examination for STI 02/10/2024   Hyperlipidemia 04/04/2023   Chest pain 03/30/2023   Lower urinary tract symptoms 07/01/2022   Vaginal candidiasis 05/12/2022   History of prediabetes 01/12/2022   Breast mass in female 10/03/2021   Subungual contusion of toenail, left, initial encounter 06/30/2020   Class 3 severe obesity with body mass index (BMI) of 40.0 to 44.9 in adult 04/13/2020   Fatigue 09/27/2019   Eczema 09/23/2016     Past Surgical History:  Procedure Laterality Date   HERNIA REPAIR     inguanal hernia repair     NO PAST SURGERIES     ORIF ANKLE FRACTURE Right 01/11/2019   Procedure: OPEN REDUCTION INTERNAL FIXATION (ORIF) ANKLE FRACTURE;  Surgeon: Beverley Evalene BIRCH, MD;  Location: [facility];  Service: Orthopedics;  Laterality: Right;    OB History     Gravida  1   Para  1   Term  1   Preterm  0   AB  0   Living  1      SAB  0   IAB  0   Ectopic  0   Multiple  0   Live Births  1            Home Medications    Prior to Admission medications   Medication Sig Start Date End Date Taking? Authorizing Provider  fluconazole  (DIFLUCAN ) 150 MG tablet Take 1 tablet (150 mg total) by mouth daily for 2 days. take 1 tablet at first signs of yeast infection and then repeat in 3 days 06/17/24 06/19/24 Yes Abad Manard, Almarie LABOR, PA-C  nitrofurantoin, macrocrystal-monohydrate, (MACROBID) 100 MG capsule Take 1 capsule (100 mg total) by mouth 2 (two) times daily. 06/17/24  Yes Odyn Turko A, PA-C  valACYclovir  (VALTREX ) 500 MG tablet Take 1 tablet (500 mg  total) by mouth daily. 03/27/24  Yes Howell Lunger, DO  metroNIDAZOLE  (FLAGYL ) 500 MG tablet Take 1 tablet (500 mg total) by mouth 2 (two) times daily. 02/10/24   Dameron, Marisa, DO  Semaglutide -Weight Management (WEGOVY ) 0.25 MG/0.5ML SOAJ Inject 0.25 mg into the skin once a week. 10/30/23   Romelle Booty, MD    Family History Family History  Problem Relation Age of Onset   Hyperlipidemia Mother    Hypertension Mother    Cancer Maternal Grandmother    Cancer Maternal Grandfather    Diabetes Paternal Grandmother    Diabetes Paternal Grandfather    Stroke Other    Sleep apnea Neg Hx     Social History Social History   Tobacco Use   Smoking status: Never   Smokeless tobacco: Never  Vaping Use   Vaping status: Never Used  Substance Use Topics   Alcohol use: No   Drug use: No     Allergies    Banana   Review of Systems Review of Systems  Constitutional:  Negative for chills and fever.  HENT:  Negative for ear pain and sore throat.   Eyes:  Negative for pain and visual disturbance.  Respiratory:  Negative for cough and shortness of breath.   Cardiovascular:  Negative for chest pain and palpitations.  Gastrointestinal:  Negative for abdominal pain and vomiting.  Genitourinary:  Positive for pelvic pain. Negative for dysuria and hematuria.  Musculoskeletal:  Positive for back pain. Negative for arthralgias.  Skin:  Negative for color change and rash.  Neurological:  Negative for seizures and syncope.  All other systems reviewed and are negative.    Physical Exam Triage Vital Signs ED Triage Vitals [06/17/24 1833]  Encounter Vitals Group     BP 121/71     Girls Systolic BP Percentile      Girls Diastolic BP Percentile      Boys Systolic BP Percentile      Boys Diastolic BP Percentile      Pulse Rate 83     Resp 16     Temp 99.3 F (37.4 C)     Temp Source Oral     SpO2 94 %     Weight      Height      Head Circumference      Peak Flow      Pain Score      Pain Loc      Pain Education      Exclude from Growth Chart    No data found.  Updated Vital Signs BP 121/71 (BP Location: Left Arm)   Pulse 83   Temp 99.3 F (37.4 C) (Oral)   Resp 16   SpO2 94%   Visual Acuity Right Eye Distance:   Left Eye Distance:   Bilateral Distance:    Right Eye Near:   Left Eye Near:    Bilateral Near:     Physical Exam Vitals and nursing note reviewed.  Constitutional:      General: She is not in acute distress.    Appearance: She is well-developed.  HENT:     Head: Normocephalic and atraumatic.   Eyes:     Conjunctiva/sclera: Conjunctivae normal.    Cardiovascular:     Rate and Rhythm: Normal rate and regular rhythm.     Heart sounds: No murmur heard. Pulmonary:     Effort: Pulmonary effort is normal. No respiratory distress.     Breath sounds:  Normal breath sounds.  Abdominal:  General: Bowel sounds are normal.     Palpations: Abdomen is soft.     Tenderness: There is abdominal tenderness in the suprapubic area. There is no guarding or rebound.   Musculoskeletal:        General: No swelling.     Cervical back: Neck supple.   Skin:    General: Skin is warm and dry.     Capillary Refill: Capillary refill takes less than 2 seconds.   Neurological:     Mental Status: She is alert.   Psychiatric:        Mood and Affect: Mood normal.      UC Treatments / Results  Labs (all labs ordered are listed, but only abnormal results are displayed) Labs Reviewed  POCT URINALYSIS DIP (MANUAL ENTRY) - Abnormal; Notable for the following components:      Result Value   Clarity, UA cloudy (*)    Ketones, POC UA trace (5) (*)    Protein Ur, POC trace (*)    Leukocytes, UA Trace (*)    All other components within normal limits  URINE CULTURE    EKG   Radiology No results found.  Procedures Procedures (including critical care time)  Medications Ordered in UC Medications - No data to display  Initial Impression / Assessment and Plan / UC Course  I have reviewed the triage vital signs and the nursing notes.  Pertinent labs & imaging results that were available during my care of the patient were reviewed by me and considered in my medical decision making (see chart for details).     Pelvic pain in female  Acute cystitis without hematuria   Urinalysis done today shows some leukocytes, protein and ketones. This could be from a urinary tract infection. We will treat for that given the symptoms but we will also send off the urine for a culture to verify if bacteria is present and to ensure the antibiotics are effective. We will treat with the following:  Macrobid 100 mg twice daily for 5 days. This is an antibiotic. Diflucan  150 mg take 1 tablet at first signs of yeast infection and then repeat in 3 days. Stay hydrated.  Drink plenty of water. Avoid a lot of caffeine Return to urgent care or PCP if symptoms worsen or fail to resolve.    Final Clinical Impressions(s) / UC Diagnoses   Final diagnoses:  Pelvic pain in female  Acute cystitis without hematuria     Discharge Instructions      Urinalysis done today shows some leukocytes, protein and ketones. This could be from a urinary tract infection. We will treat for that given the symptoms but we will also send off the urine for a culture to verify if bacteria is present and to ensure the antibiotics are effective. We will treat with the following:  Macrobid 100 mg twice daily for 5 days. This is an antibiotic. Diflucan  150 mg take 1 tablet at first signs of yeast infection and then repeat in 3 days. Stay hydrated. Drink plenty of water. Avoid a lot of caffeine Return to urgent care or PCP if symptoms worsen or fail to resolve.       ED Prescriptions     Medication Sig Dispense Auth. Provider   nitrofurantoin, macrocrystal-monohydrate, (MACROBID) 100 MG capsule Take 1 capsule (100 mg total) by mouth 2 (two) times daily. 10 capsule Drae Mitzel A, PA-C   fluconazole  (DIFLUCAN ) 150 MG tablet Take 1 tablet (150 mg total) by mouth daily  for 2 days. take 1 tablet at first signs of yeast infection and then repeat in 3 days 2 tablet Teresa Almarie LABOR, PA-C      PDMP not reviewed this encounter.   Teresa Almarie LABOR DEVONNA 06/17/24 1914

## 2024-06-17 NOTE — Discharge Instructions (Addendum)
 Urinalysis done today shows some leukocytes, protein and ketones. This could be from a urinary tract infection. We will treat for that given the symptoms but we will also send off the urine for a culture to verify if bacteria is present and to ensure the antibiotics are effective. We will treat with the following:  Macrobid 100 mg twice daily for 5 days. This is an antibiotic. Diflucan  150 mg take 1 tablet at first signs of yeast infection and then repeat in 3 days. Stay hydrated. Drink plenty of water. Avoid a lot of caffeine Return to urgent care or PCP if symptoms worsen or fail to resolve.

## 2024-06-17 NOTE — ED Triage Notes (Signed)
 Lower abdominal pain and cramping x 3 days.

## 2024-06-18 DIAGNOSIS — J309 Allergic rhinitis, unspecified: Secondary | ICD-10-CM | POA: Diagnosis not present

## 2024-06-18 LAB — URINE CULTURE: Culture: NO GROWTH

## 2024-06-19 ENCOUNTER — Ambulatory Visit (HOSPITAL_COMMUNITY): Payer: Self-pay

## 2024-08-08 ENCOUNTER — Encounter (HOSPITAL_COMMUNITY): Payer: Self-pay

## 2024-08-08 ENCOUNTER — Ambulatory Visit (HOSPITAL_COMMUNITY)
Admission: EM | Admit: 2024-08-08 | Discharge: 2024-08-08 | Disposition: A | Discharge status: Home / Self Care | Attending: Family Medicine | Admitting: Family Medicine

## 2024-08-08 ENCOUNTER — Ambulatory Visit (HOSPITAL_COMMUNITY): Payer: Self-pay | Admitting: Internal Medicine

## 2024-08-08 DIAGNOSIS — R202 Paresthesia of skin: Secondary | ICD-10-CM | POA: Diagnosis not present

## 2024-08-08 DIAGNOSIS — L299 Pruritus, unspecified: Secondary | ICD-10-CM | POA: Diagnosis not present

## 2024-08-08 DIAGNOSIS — R079 Chest pain, unspecified: Secondary | ICD-10-CM | POA: Insufficient documentation

## 2024-08-08 DIAGNOSIS — M549 Dorsalgia, unspecified: Secondary | ICD-10-CM | POA: Diagnosis not present

## 2024-08-08 LAB — COMPREHENSIVE METABOLIC PANEL WITH GFR
ALT: 29 U/L (ref 0–44)
AST: 21 U/L (ref 15–41)
Albumin: 3.8 g/dL (ref 3.5–5.0)
Alkaline Phosphatase: 62 U/L (ref 38–126)
Anion gap: 10 (ref 5–15)
BUN: 13 mg/dL (ref 6–20)
CO2: 25 mmol/L (ref 22–32)
Calcium: 9.5 mg/dL (ref 8.9–10.3)
Chloride: 103 mmol/L (ref 98–111)
Creatinine, Ser: 0.75 mg/dL (ref 0.44–1.00)
GFR, Estimated: >60 mL/min (ref >60)
Glucose, Bld: 87 mg/dL (ref 70–99)
Potassium: 4.2 mmol/L (ref 3.5–5.1)
Sodium: 138 mmol/L (ref 135–145)
Total Bilirubin: 0.9 mg/dL (ref 0.0–1.2)
Total Protein: 6.9 g/dL (ref 6.5–8.1)

## 2024-08-08 NOTE — ED Provider Notes (Signed)
 [facility]    CSN: [id number] Arrival date & time: 08/08/24  1247      History   Chief Complaint Chief Complaint  Patient presents with   Chest Pain   Tingling   Numbness    HPI Emma Cantu is a 28 y.o. female who presents with  upper back pain between clavicles that started this am around 10:30 am. Felt similar to the pain she had yesterday pm in central chest and radiated through her chest and had sensation she had to  burp and tried but could not, so she tried to cough, and after 2 episodes of cough those symptoms resolved.  Today as the day went on she also started feeling L arm numbness, she went hom and took her BP and the top was in the mid upper 140's and lower in the 90's, so she got worried and came here for eval.  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 .  She has also been having itching all over her body and saw her PCP  a few weeks ago who told her it was some kind of allergy, so placed her on Benadryl , but has not helped.      Past Medical History:  Diagnosis Date   Herpes    High cholesterol    High cholesterol    Nexplanon  insertion 12/2012   Right Arm   No pertinent past medical history    Pulmonary embolism (HCC)    provoked by ankle injury (winter 2020)     Patient Active Problem List   Diagnosis Date Noted   Bacterial vaginosis 02/10/2024   Screening examination for STI 02/10/2024   Hyperlipidemia 04/04/2023   Chest pain 03/30/2023   Lower urinary tract symptoms 07/01/2022   Vaginal candidiasis 05/12/2022   History of prediabetes 01/12/2022   Breast mass in female 10/03/2021   Subungual contusion of toenail, left, initial encounter 06/30/2020   Class 3 severe obesity with body mass index (BMI) of 40.0 to 44.9 in adult 04/13/2020   Fatigue 09/27/2019   Eczema 09/23/2016    Past Surgical History:  Procedure Laterality Date   HERNIA REPAIR     inguanal hernia repair      NO PAST SURGERIES     ORIF ANKLE FRACTURE Right 01/11/2019   Procedure: OPEN REDUCTION INTERNAL FIXATION (ORIF) ANKLE FRACTURE;  Surgeon: Beverley Evalene BIRCH, MD;  Location: [facility];  Service: Orthopedics;  Laterality: Right;    OB History     Gravida  1   Para  1   Term  1   Preterm  0   AB  0   Living  1      SAB  0   IAB  0   Ectopic  0   Multiple  0   Live Births  1            Home Medications    Prior to Admission medications   Medication Sig Start Date End Date Taking? Authorizing Provider  valACYclovir  (VALTREX ) 500 MG tablet Take 1 tablet (500 mg total) by mouth daily. 03/27/24   Howell Lunger, DO    Family History Family History  Problem Relation Age of Onset   Hyperlipidemia Mother    Hypertension Mother    Cancer Maternal Grandmother    Cancer Maternal Grandfather    Diabetes Paternal Grandmother    Diabetes Paternal Grandfather    Stroke Other  Sleep apnea Neg Hx     Social History Social History   Tobacco Use   Smoking status: Never   Smokeless tobacco: Never  Vaping Use   Vaping status: Never Used  Substance Use Topics   Alcohol use: No   Drug use: No     Allergies   Banana   Review of Systems Review of Systems As noted per HPI  Physical Exam Triage Vital Signs ED Triage Vitals  Encounter Vitals Group     BP 08/08/24 1257 135/89     Girls Systolic BP Percentile --      Girls Diastolic BP Percentile --      Boys Systolic BP Percentile --      Boys Diastolic BP Percentile --      Pulse Rate 08/08/24 1257 82     Resp 08/08/24 1257 16     Temp 08/08/24 1257 98.9 F (37.2 C)     Temp Source 08/08/24 1257 Oral     SpO2 08/08/24 1257 100 %     Weight --      Height --      Head Circumference --      Peak Flow --      Pain Score 08/08/24 1259 3     Pain Loc --      Pain Education --      Exclude from Growth Chart --    No data found.  Updated Vital Signs BP 135/89 (BP Location: Right Arm)    Pulse 82   Temp 98.9 F (37.2 C) (Oral)   Resp 16   SpO2 100%   Visual Acuity Right Eye Distance:   Left Eye Distance:   Bilateral Distance:    Right Eye Near:   Left Eye Near:    Bilateral Near:     Physical Exam Vitals and nursing note reviewed.  Constitutional:      General: She is not in acute distress.    Appearance: She is obese. She is not toxic-appearing.  HENT:     Right Ear: External ear normal.     Left Ear: External ear normal.  Eyes:     General: No scleral icterus.    Conjunctiva/sclera: Conjunctivae normal.  Neck:     Comments: Has mild tenderness on L top trapezius, but no tenderness present on upper thoracic region. This was not provoked either with spine or neck movement.  Cardiovascular:     Rate and Rhythm: Normal rate and regular rhythm.     Heart sounds: No murmur heard. Pulmonary:     Effort: Pulmonary effort is normal.     Breath sounds: Normal breath sounds. No wheezing.     Comments: She has tenderness on L mid sternal region but this did not provoke the pain she felt last night.  Musculoskeletal:        General: No tenderness or deformity. Normal range of motion.     Cervical back: Neck supple.  Lymphadenopathy:     Cervical: No cervical adenopathy.  Skin:    General: Skin is warm and dry.     Findings: No bruising or erythema.  Neurological:     Mental Status: She is alert and oriented to person, place, and time.     Gait: Gait normal.     Deep Tendon Reflexes: Reflexes normal.     Comments: Has slight decreased sensation of L arm compared to R with light touch.   Psychiatric:        Mood  and Affect: Mood normal.        Behavior: Behavior normal.        Thought Content: Thought content normal.        Judgment: Judgment normal.      UC Treatments / Results  Labs (all labs ordered are listed, but only abnormal results are displayed) Labs Reviewed  COMPREHENSIVE METABOLIC PANEL WITH GFR    EKG NSR, normal ELG  Radiology No  results found.  Procedures Procedures (including critical care time)  Medications Ordered in UC Medications - No data to display  Initial Impression / Assessment and Plan / UC Course  I have reviewed the triage vital signs and the nursing notes.  Left arm paresthesia of unknown cause Upper central back pain resolved Itching all over   I ordered a CMP to check her liver enzymes in case the itching could be from elevation of liver enzymes. She needs to FU with PCP for L arm paresthesia.     Final Clinical Impressions(s) / UC Diagnoses   Final diagnoses:  Itching  Tingling  Arm paresthesia, left  Chest pain, unspecified type  Upper back pain     Discharge Instructions      Your EKG was normal We will inform you when the Chemistry blood work comes back.  Please make a follow up appointment with your primary care doctor for the left arm numbness so you can have more testing.      ED Prescriptions   None    PDMP not reviewed this encounter.   Lindi Carter, PA-C 08/08/24 1529

## 2024-08-08 NOTE — Discharge Instructions (Addendum)
 Your EKG was normal We will inform you when the Chemistry blood work comes back.  Please make a follow up appointment with your primary care doctor for the left arm numbness so you can have more testing.

## 2024-08-08 NOTE — ED Triage Notes (Signed)
 Patient c/o mid and left chest pain that started around 1030. 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  Patient also report that she has had tingling in her body x 2 weeks and is now localized to her right hip to toes.  Patient also reports that she has had numbness from the posterior neck and down the left arm today as well.

## 2024-08-13 ENCOUNTER — Other Ambulatory Visit: Payer: Self-pay

## 2024-08-13 ENCOUNTER — Emergency Department (HOSPITAL_COMMUNITY)
Admission: EM | Admit: 2024-08-13 | Discharge: 2024-08-13 | Disposition: A | Discharge status: Home / Self Care | Attending: Emergency Medicine | Admitting: Emergency Medicine

## 2024-08-13 ENCOUNTER — Emergency Department (HOSPITAL_COMMUNITY)

## 2024-08-13 ENCOUNTER — Encounter (HOSPITAL_COMMUNITY): Payer: Self-pay

## 2024-08-13 DIAGNOSIS — H1032 Unspecified acute conjunctivitis, left eye: Secondary | ICD-10-CM | POA: Diagnosis not present

## 2024-08-13 DIAGNOSIS — R202 Paresthesia of skin: Secondary | ICD-10-CM | POA: Insufficient documentation

## 2024-08-13 DIAGNOSIS — H5712 Ocular pain, left eye: Secondary | ICD-10-CM | POA: Diagnosis present

## 2024-08-13 LAB — PREGNANCY, URINE: Preg Test, Ur: NEGATIVE

## 2024-08-13 NOTE — ED Provider Notes (Signed)
 Emma Cantu EMERGENCY DEPARTMENT AT [facility] ([place]) Provider Note   CSN: [id number] Arrival date & time: 08/13/24  9372     Patient presents with: Eye Pain   Emma Cantu is a 28 y.o. female.   Pt complains of having a pain in her left eye this am.  Pt reports pain was really sharp.  Pain has resolved and she reports some eye irritation.  Pt reports for the past year she has had some episodes of tingling in muliple different areas of her body.  Pt reports she was seen at urgent care and had blood work.  Patient reports she is continued to have different areas of numbness.  Patient reports the areas come and go.  Patient denies any fever or chills she denies any cough or congestion.  Patient denies any chest pain she is not having any abdominal pain  The history is provided by the patient. No language interpreter was used.  Eye Pain       Prior to Admission medications   Medication Sig Start Date End Date Taking? Authorizing Provider  valACYclovir  (VALTREX ) 500 MG tablet Take 1 tablet (500 mg total) by mouth daily. 03/27/24   Howell Lunger, DO    Allergies: Banana    Review of Systems  Eyes:  Positive for pain.  All other systems reviewed and are negative.   Updated Vital Signs BP (!) 133/90 (BP Location: Right Arm)   Pulse 84   Temp 98.8 F (37.1 C)   Resp 18   Ht 5' 4 (1.626 m)   Wt 114.8 kg   SpO2 100%   BMI 43.43 kg/m   Physical Exam Vitals and nursing note reviewed.  Constitutional:      Appearance: She is well-developed.  HENT:     Head: Normocephalic and atraumatic.     Mouth/Throat:     Mouth: Mucous membranes are moist.  Eyes:     Extraocular Movements: Extraocular movements intact.     Conjunctiva/sclera: Conjunctivae normal.     Pupils: Pupils are equal, round, and reactive to light.  Cardiovascular:     Rate and Rhythm: Normal rate.  Pulmonary:     Effort: Pulmonary effort is normal.  Abdominal:     General: There is no distension.   Musculoskeletal:        General: Normal range of motion.     Cervical back: Normal range of motion.  Skin:    General: Skin is warm.  Neurological:     General: No focal deficit present.     Mental Status: She is alert and oriented to person, place, and time.     (all labs ordered are listed, but only abnormal results are displayed) Labs Reviewed  PREGNANCY, URINE    EKG: None  Radiology: CT Head Wo Contrast Result Date: 08/13/2024 EXAM: CT HEAD WITHOUT CONTRAST 08/13/2024 07:37:00 AM TECHNIQUE: CT of the head was performed without the administration of intravenous contrast. Automated exposure control, iterative reconstruction, and/or weight based adjustment of the mA/kV was utilized to reduce the radiation dose to as low as reasonably achievable. COMPARISON: None available. CLINICAL HISTORY: Numbness or tingling, paresthesia (Ped 0-17y). No contrast; Pt arrived from home via POV c/o left eye pain. Pt states that it was really sharp, but is better now. 2/10 on pain scale at present. Zero noted redness. FINDINGS: BRAIN AND VENTRICLES: No acute hemorrhage. Gray-white differentiation is preserved. No hydrocephalus. No extra-axial collection. No mass effect or midline shift. ORBITS: No acute abnormality.  SINUSES: There is mild ethmoid air cell disease present. SOFT TISSUES AND SKULL: No acute soft tissue abnormality. No skull fracture. IMPRESSION: 1. No acute intracranial abnormality. 2. Mild ethmoid air cell disease. Electronically signed by: Evalene Coho MD 08/13/2024 07:50 AM EDT RP Workstation: [id number]     Procedures   Medications Ordered in the ED - No data to display                                  Medical Decision Making Patient reports over the past year she has had areas of numbness and tingling that come and go she is not currently having any numbness or tingling.  Patient became concerned because she had a pain in her left eye this a.m.  Patient reports pain has  currently resolved.  Amount and/or Complexity of Data Reviewed External Data Reviewed: notes.    Details: Urgent care notes reviewed Radiology: ordered.    Details: CT head shows no acute findings  Risk Risk Details: Patient counseled on results.  I have advised her to follow-up with neurology for evaluation.  Patient advised to return to the emergency department if symptoms worsen or change.        Final diagnoses:  Acute conjunctivitis of left eye, unspecified acute conjunctivitis type  Tingling in extremities    ED Discharge Orders     None       An After Visit Summary was printed and given to the patient.    Emma Cantu Emma Cantu 08/13/24 1019    Emma Charleston, MD 08/13/24 [phone]

## 2024-08-13 NOTE — Discharge Instructions (Addendum)
 See your Physician for recheck.  Schedule eye exam for evaluation

## 2024-08-13 NOTE — ED Triage Notes (Signed)
 Pt arrived from home via POV c/o left eye pain. Pt states that it was really sharp, but is better now. 2/10 on pain scale at present. Zero noted redness

## 2024-08-18 DIAGNOSIS — Z3200 Encounter for pregnancy test, result unknown: Secondary | ICD-10-CM | POA: Diagnosis not present

## 2024-08-18 DIAGNOSIS — R195 Other fecal abnormalities: Secondary | ICD-10-CM | POA: Diagnosis not present

## 2024-08-18 DIAGNOSIS — K219 Gastro-esophageal reflux disease without esophagitis: Secondary | ICD-10-CM | POA: Diagnosis not present

## 2024-10-01 ENCOUNTER — Telehealth: Payer: Self-pay | Admitting: Neurology

## 2024-10-01 NOTE — Telephone Encounter (Signed)
 Patient reschedule appointment due to scheduling conflict.

## 2024-10-02 ENCOUNTER — Ambulatory Visit: Admitting: Neurology

## 2024-10-15 DIAGNOSIS — R195 Other fecal abnormalities: Secondary | ICD-10-CM | POA: Diagnosis not present

## 2024-11-05 DIAGNOSIS — R195 Other fecal abnormalities: Secondary | ICD-10-CM | POA: Diagnosis not present

## 2024-11-05 DIAGNOSIS — K529 Noninfective gastroenteritis and colitis, unspecified: Secondary | ICD-10-CM | POA: Diagnosis not present

## 2024-11-10 DIAGNOSIS — R030 Elevated blood-pressure reading, without diagnosis of hypertension: Secondary | ICD-10-CM | POA: Diagnosis not present

## 2024-11-10 DIAGNOSIS — K219 Gastro-esophageal reflux disease without esophagitis: Secondary | ICD-10-CM | POA: Diagnosis not present

## 2024-11-10 DIAGNOSIS — E559 Vitamin D deficiency, unspecified: Secondary | ICD-10-CM | POA: Diagnosis not present

## 2024-11-10 DIAGNOSIS — Z79899 Other long term (current) drug therapy: Secondary | ICD-10-CM | POA: Diagnosis not present

## 2024-11-10 DIAGNOSIS — E7800 Pure hypercholesterolemia, unspecified: Secondary | ICD-10-CM | POA: Diagnosis not present

## 2024-11-10 DIAGNOSIS — Z6841 Body Mass Index (BMI) 40.0 and over, adult: Secondary | ICD-10-CM | POA: Diagnosis not present

## 2024-11-10 DIAGNOSIS — J309 Allergic rhinitis, unspecified: Secondary | ICD-10-CM | POA: Diagnosis not present

## 2025-02-27 ENCOUNTER — Ambulatory Visit: Admitting: Neurology
